# Patient Record
Sex: Female | Born: 1985 | Hispanic: No | Marital: Married | Smoking: Current every day smoker
Health system: Southern US, Community
[De-identification: ages and names within clinical notes are randomized; demographics above are authoritative.]

## PROBLEM LIST (undated history)

## (undated) DIAGNOSIS — L732 Hidradenitis suppurativa: Secondary | ICD-10-CM

## (undated) DIAGNOSIS — I10 Essential (primary) hypertension: Secondary | ICD-10-CM

## (undated) DIAGNOSIS — A630 Anogenital (venereal) warts: Secondary | ICD-10-CM

## (undated) DIAGNOSIS — D649 Anemia, unspecified: Secondary | ICD-10-CM

## (undated) DIAGNOSIS — R87629 Unspecified abnormal cytological findings in specimens from vagina: Secondary | ICD-10-CM

## (undated) HISTORY — DX: Hidradenitis suppurativa: L73.2

## (undated) HISTORY — DX: Anogenital (venereal) warts: A63.0

## (undated) HISTORY — DX: Unspecified abnormal cytological findings in specimens from vagina: R87.629

## (undated) SURGERY — EXCISION, HIDRADENITIS, AXILLA
Anesthesia: General | Laterality: Left

---
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
## 2003-05-05 ENCOUNTER — Encounter: Admission: RE | Admit: 2003-05-05 | Discharge: 2003-05-05 | Payer: Self-pay | Admitting: Cardiology

## 2003-05-05 ENCOUNTER — Encounter: Payer: Self-pay | Admitting: Cardiology

## 2003-07-16 ENCOUNTER — Other Ambulatory Visit: Admission: RE | Admit: 2003-07-16 | Discharge: 2003-07-16 | Payer: Self-pay | Admitting: Obstetrics & Gynecology

## 2005-09-13 ENCOUNTER — Other Ambulatory Visit: Admission: RE | Admit: 2005-09-13 | Discharge: 2005-09-13 | Payer: Self-pay | Admitting: Obstetrics and Gynecology

## 2006-02-04 ENCOUNTER — Emergency Department (HOSPITAL_COMMUNITY): Admission: EM | Admit: 2006-02-04 | Discharge: 2006-02-04 | Payer: Self-pay | Admitting: Emergency Medicine

## 2006-03-30 ENCOUNTER — Inpatient Hospital Stay (HOSPITAL_COMMUNITY): Admission: AD | Admit: 2006-03-30 | Discharge: 2006-04-02 | Payer: Self-pay | Admitting: Obstetrics and Gynecology

## 2006-05-08 ENCOUNTER — Other Ambulatory Visit: Admission: RE | Admit: 2006-05-08 | Discharge: 2006-05-08 | Payer: Self-pay | Admitting: Obstetrics and Gynecology

## 2007-06-20 ENCOUNTER — Other Ambulatory Visit: Admission: RE | Admit: 2007-06-20 | Discharge: 2007-06-20 | Payer: Self-pay | Admitting: Obstetrics and Gynecology

## 2008-05-15 ENCOUNTER — Ambulatory Visit (HOSPITAL_COMMUNITY): Admission: RE | Admit: 2008-05-15 | Discharge: 2008-05-15 | Payer: Self-pay | Admitting: Obstetrics

## 2008-05-15 IMAGING — US US OB DETAIL+14 WK
1 series · 14 of 28 positions shown · non-contrast
Comparison: none

OBSTETRICAL ULTRASOUND:
 This ultrasound exam was performed in the [HOSPITAL] Ultrasound Department.  The OB US report was generated in the AS system, and faxed to the ordering physician.  This report is also available in [REDACTED] PACS.

[Series 1: us ob detail+14 wk · 0.28mm/px · 14 of 86 slices shown]
[im 4/86]
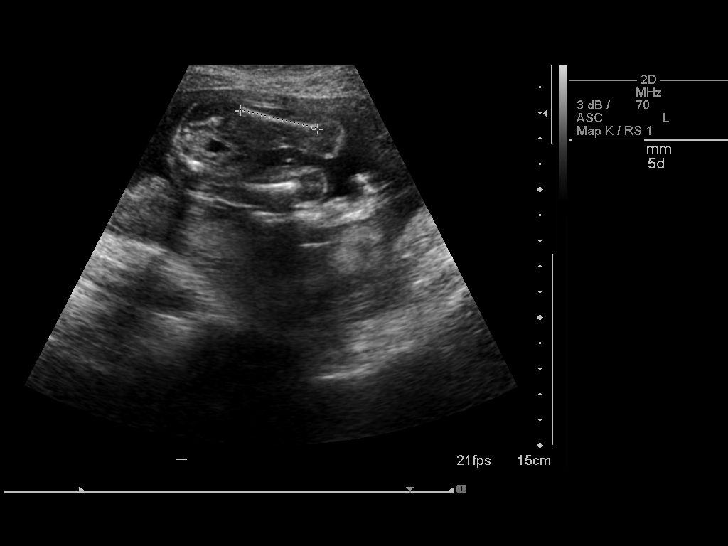
[im 10/86]
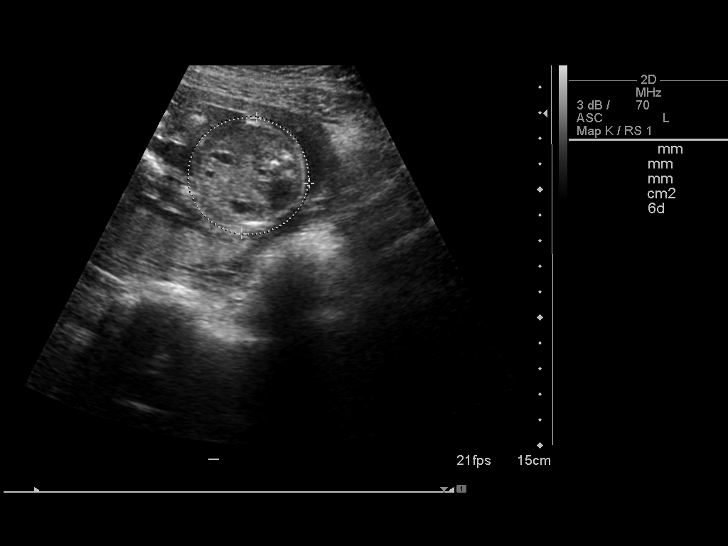
[im 16/86]
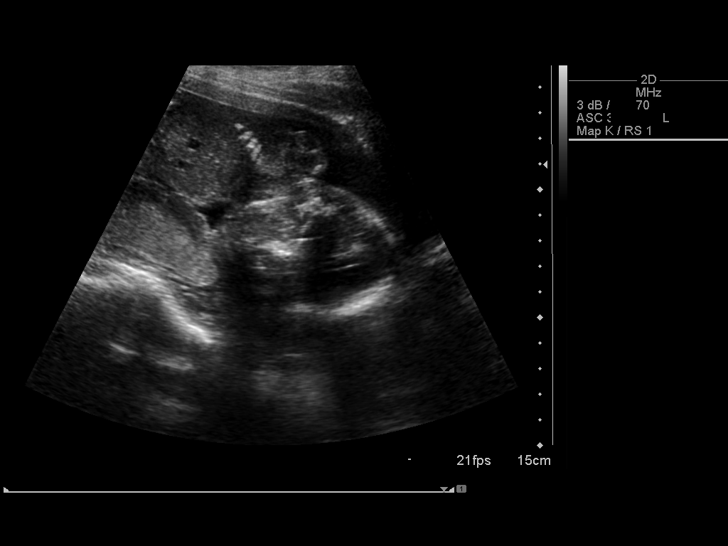
[im 23/86]
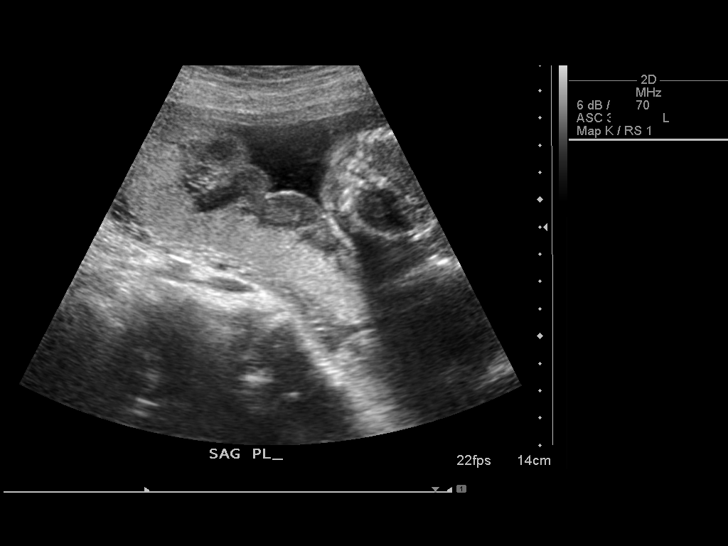
[im 29/86]
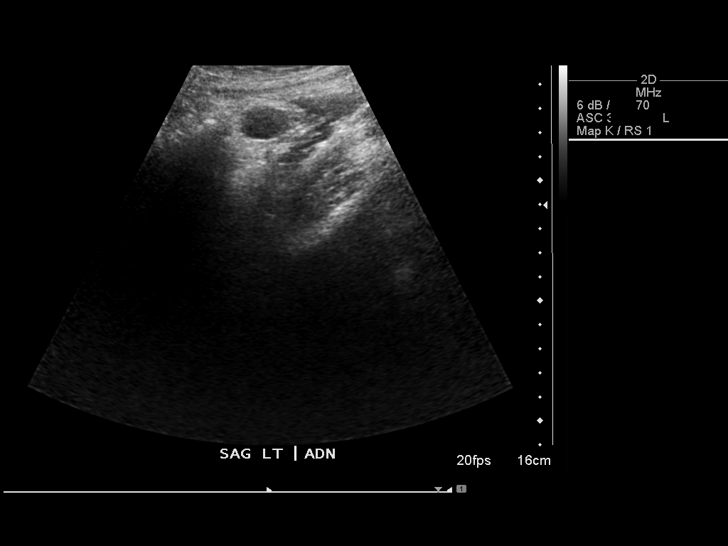
[im 35/86]
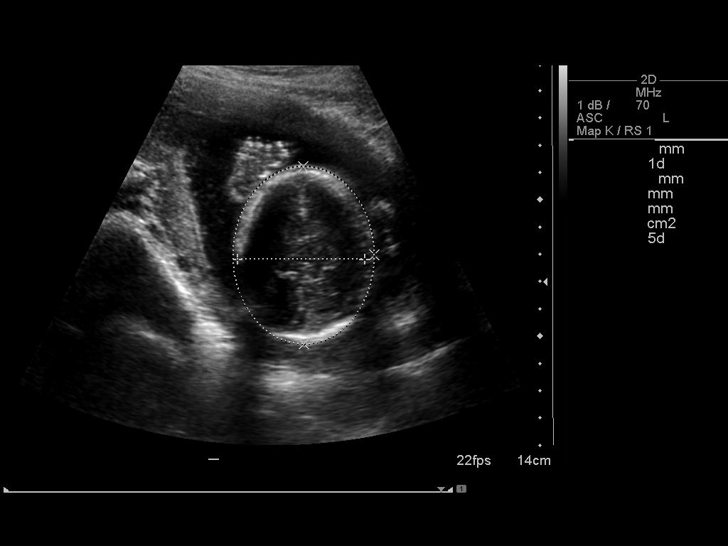
[im 41/86]
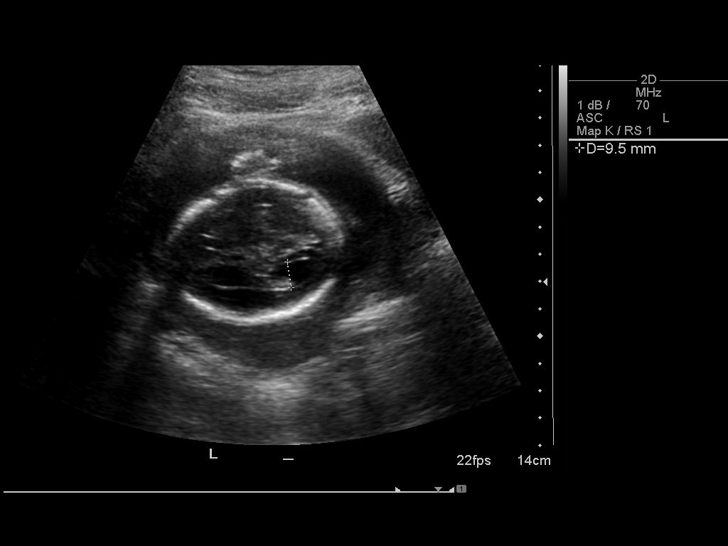
[im 48/86]
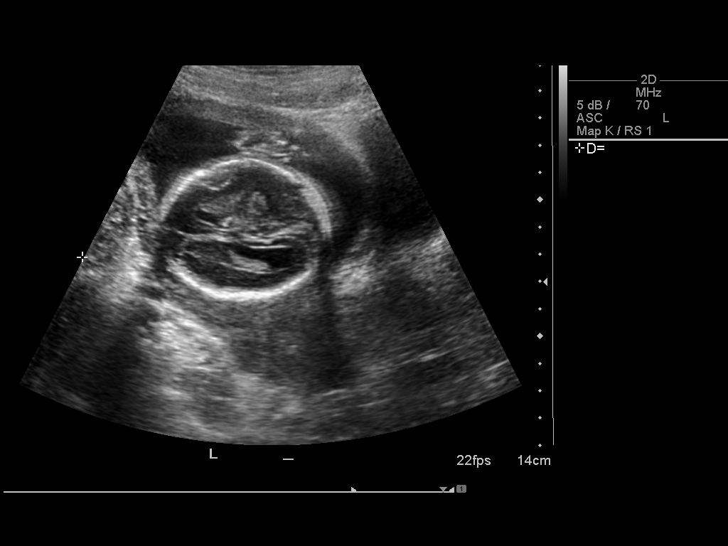
[im 54/86]
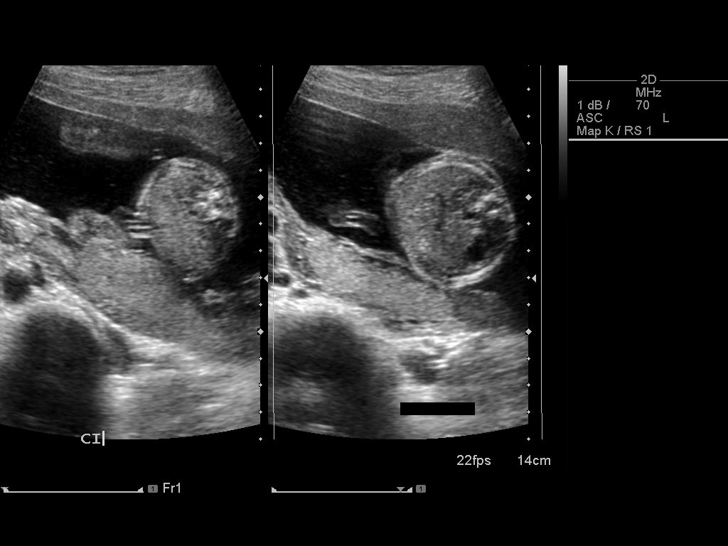
[im 60/86]
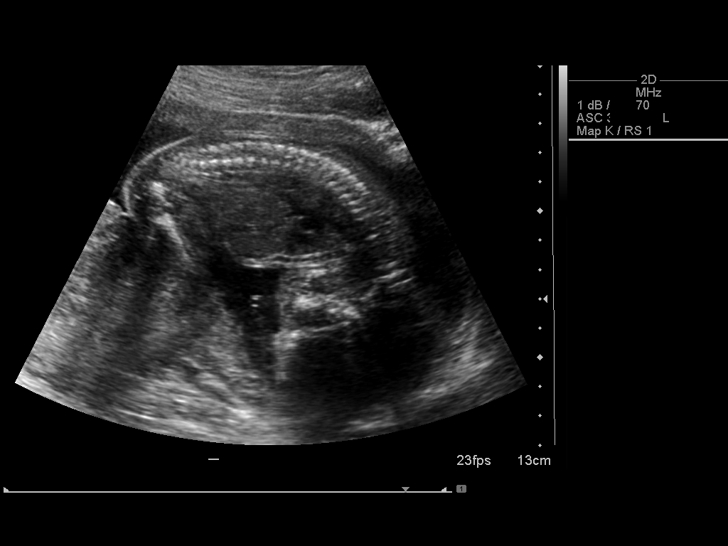
[im 67/86]
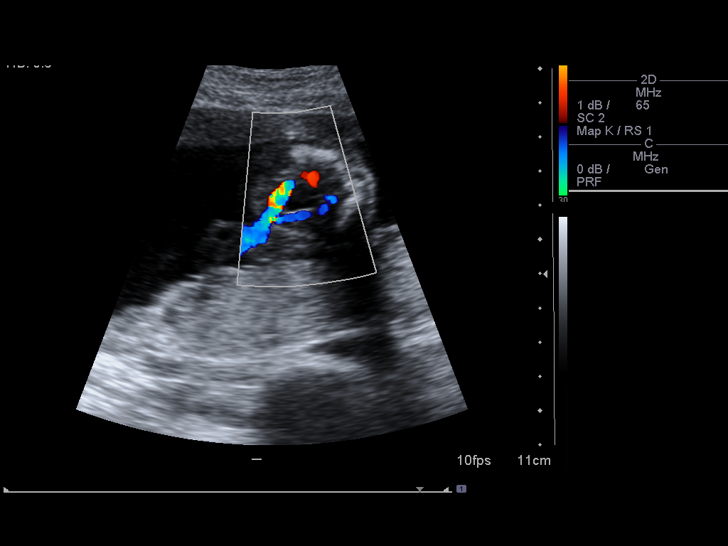
[im 73/86]
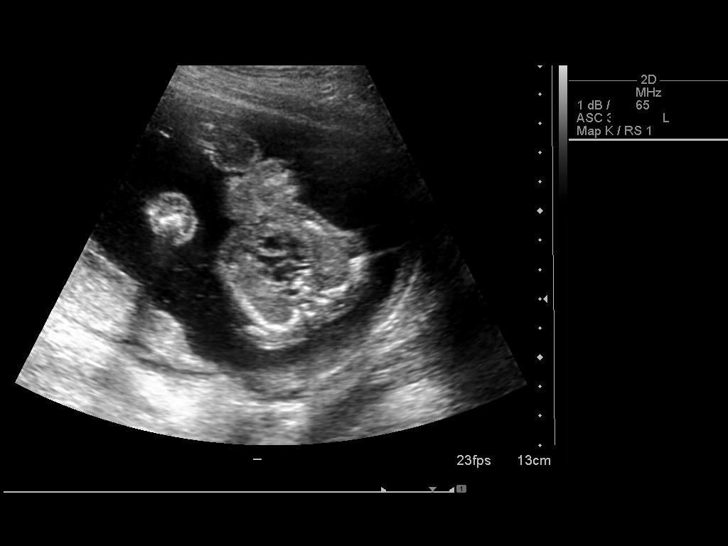
[im 79/86]
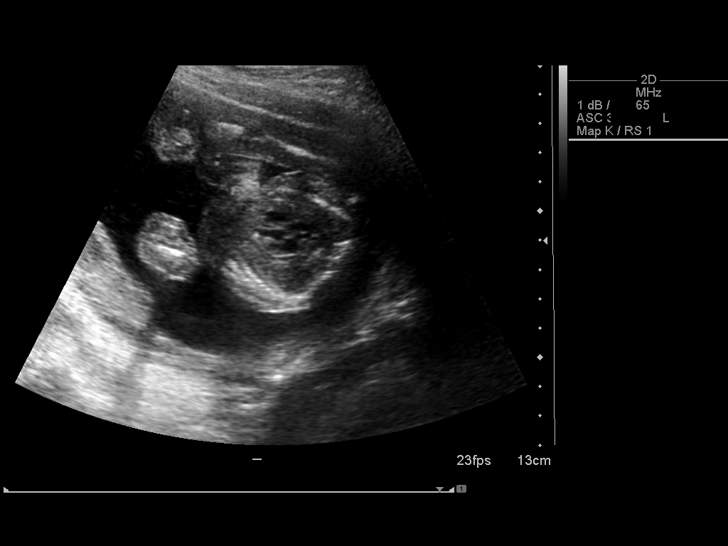
[im 86/86]
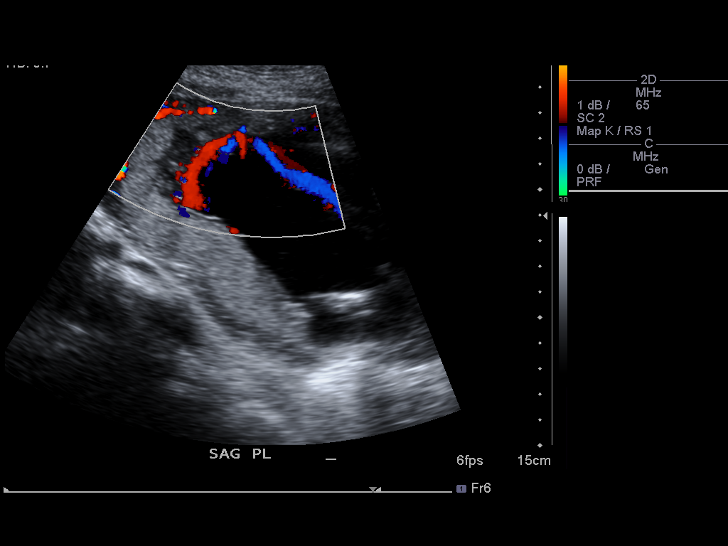

[14 of 28 positions shown; findings below may reference images not displayed]

IMPRESSION: See AS Obstetric US report.

## 2008-06-07 ENCOUNTER — Inpatient Hospital Stay (HOSPITAL_COMMUNITY): Admission: AD | Admit: 2008-06-07 | Discharge: 2008-06-07 | Payer: Self-pay | Admitting: Obstetrics

## 2008-06-12 ENCOUNTER — Ambulatory Visit (HOSPITAL_COMMUNITY): Admission: RE | Admit: 2008-06-12 | Discharge: 2008-06-12 | Payer: Self-pay | Admitting: Obstetrics

## 2008-07-03 ENCOUNTER — Ambulatory Visit (HOSPITAL_COMMUNITY): Admission: RE | Admit: 2008-07-03 | Discharge: 2008-07-03 | Payer: Self-pay | Admitting: Obstetrics

## 2008-09-10 ENCOUNTER — Inpatient Hospital Stay (HOSPITAL_COMMUNITY): Admission: AD | Admit: 2008-09-10 | Discharge: 2008-09-13 | Payer: Self-pay | Admitting: Obstetrics

## 2008-09-30 ENCOUNTER — Emergency Department (HOSPITAL_COMMUNITY): Admission: EM | Admit: 2008-09-30 | Discharge: 2008-09-30 | Payer: Self-pay | Admitting: Emergency Medicine

## 2009-08-11 ENCOUNTER — Emergency Department (HOSPITAL_COMMUNITY): Admission: EM | Admit: 2009-08-11 | Discharge: 2009-08-11 | Payer: Self-pay | Admitting: Emergency Medicine

## 2010-05-12 ENCOUNTER — Emergency Department (HOSPITAL_COMMUNITY): Admission: EM | Admit: 2010-05-12 | Discharge: 2010-05-12 | Payer: Self-pay | Admitting: Emergency Medicine

## 2010-11-17 LAB — CULTURE, ROUTINE-ABSCESS

## 2010-12-19 LAB — CBC
HCT: 25.6 % — ABNORMAL LOW (ref 36.0–46.0)
HCT: 30.4 % — ABNORMAL LOW (ref 36.0–46.0)
Hemoglobin: 10.1 g/dL — ABNORMAL LOW (ref 12.0–15.0)
Hemoglobin: 8.7 g/dL — ABNORMAL LOW (ref 12.0–15.0)
MCHC: 33.2 g/dL (ref 30.0–36.0)
MCHC: 34.1 g/dL (ref 30.0–36.0)
MCV: 92.5 fL (ref 78.0–100.0)
MCV: 92.6 fL (ref 78.0–100.0)
Platelets: 192 K/uL (ref 150–400)
Platelets: 204 K/uL (ref 150–400)
RBC: 2.77 MIL/uL — ABNORMAL LOW (ref 3.87–5.11)
RBC: 3.29 MIL/uL — ABNORMAL LOW (ref 3.87–5.11)
RDW: 13.5 % (ref 11.5–15.5)
RDW: 13.6 % (ref 11.5–15.5)
WBC: 10.1 K/uL (ref 4.0–10.5)
WBC: 13.8 K/uL — ABNORMAL HIGH (ref 4.0–10.5)

## 2010-12-19 LAB — RPR: RPR Ser Ql: NONREACTIVE

## 2011-01-20 NOTE — Consult Note (Signed)
NAMEGALE, KLAR NO.:  [id number]   MEDICAL RECORD NO.:  [id number]          PATIENT TYPE:  EMS   LOCATION:  ED                           FACILITY:  [facility]   PHYSICIAN:  Lorne Skeens. Hoxworth, M.D.DATE OF BIRTH:  [birth year]   DATE OF CONSULTATION:  02/04/2006  DATE OF DISCHARGE:                                   CONSULTATION   CHIEF COMPLAINT:  Pain, swelling, left thigh.   PRESENT ILLNESS:  Shannon Montoya is a 25 year old black female seen at the  request of Dr. Earl Lites from urgent medical.  She was seen in the [facility] Emergency Room.  She is a 19-year black female who is 62 weeks  estimated gestational age with her first pregnancy.  She presents  complaining of four to five-day history of increasing pain, swelling, and  tenderness of her proximal left thigh.  She has no history of any similar  symptoms.  No subjective fever or chills.   PAST MEDICAL HISTORY:  Very healthy.  No previous medical or surgical  illnesses.   ILLNESSES:  Medications.   ALLERGIES:  None.   SOCIAL HISTORY:  No cigarettes, alcohol.   FAMILY HISTORY:  Noncontributory.   REVIEW OF SYSTEMS:  All negative.   PHYSICAL EXAMINATION:  VITAL SIGNS:  Temperature is 99 heart rate 91,  respirations 16, blood pressure 116/71.  GENERAL:  A well-developed, alert and young black female.  SKIN:  Warm and dry.  LUNGS:  Clear without wheezing or increasd work of breathing.  ABDOMEN:  Soft, nontender.  Uterus consistent with a 29-week pregnancy.  CARDIAC:  Regular rate and rhythm without murmurs.  EXTREMITIES:  In the proximal left thigh immediately a couple of centimeters  below the groin crease is an approximately 5 x 3 cm area of fluctuance,  tenderness and about 2 cm of surrounding erythema.  No drainage.   ASSESSMENT/PLAN:  Left thigh abscess.   Under local anesthesia in the emergency room after explaining the procedure  this was incised and a large amount of purulent material  drained.  Iodoform  wick was left in the abscess cavity.  She will be started on Keflex 500 mg  t.i.d.  Cultures were obtained.  She is to call the office for any  worsening.  Wound care was explained.  I will see her back in follow-up in  three to four days.      Lorne Skeens. Hoxworth, M.D.  Electronically Signed     BTH/MEDQ  D:  02/04/2006  T:  02/05/2006  Job:  [id number]   cc:   Brett Canales A. Cleta Alberts, M.D.  Fax: [phone]   Rudy Jew. Ashley Royalty, M.D.  Fax: [phone]

## 2011-01-20 NOTE — Discharge Summary (Signed)
[name], [name]             ACCOUNT NO.:  [id number]   MEDICAL RECORD NO.:  [id number]          PATIENT TYPE:  INP   LOCATION:  9118                          FACILITY:  [facility]   PHYSICIAN:  James A. Ashley Royalty, M.D.DATE OF BIRTH:  February 22, 1986   DATE OF ADMISSION:  03/31/2006  DATE OF DISCHARGE:  04/02/2006                                 DISCHARGE SUMMARY   DISCHARGE DIAGNOSES:  1. Intrauterine pregnancy at 36 weeks 4 days gestation, labor.  2. Group B strep carrier.  3. Spontaneous rupture of membranes.  4. Pregnancy-induced hypertension.  5. Term birth living child, vertex.   CONSULTATIONS:  None.   DISCHARGE MEDICATIONS:  1. Percocet.  2. Motrin 600 mg.   HISTORY AND PHYSICAL:  This is a 25 year old, gravida 2, para 0-0-1-0, 36  weeks 4 days gestation.  The patient presented complaining of spontaneous  rupture of membranes.  Cervix was alleged to be 1 cm dilated in Maternity  Admissions Unit.  Initial blood pressures were noted to be in the 130/80  range.   HOSPITAL COURSE:  The patient was admitted to [facility]  [place].  Admission laboratory studies were drawn including a PIH panel  which was reassuring.  Pitocin was initiated as well as penicillin.  The  patient went on to deliver on 03/31/06.  The infant was noted to be a 4-  pound 3-ounce female, Apgars 9 at 1 minute and 9 at 5 minutes, sent to the  newborn nursery.  Delivery was accomplished by Dr. Sydnee Cabal over an intact  perineum.  There were no lacerations.  Magnesium sulfate therapy was  initiated according to the department's notes, but on cursory examination of  the orders I did not see an order for same.  At any rate on 04/01/06 the  patient was felt by Dr. Sydnee Cabal to have improvement of her pregnancy  associated hypertension.  On 04/02/06 she was evaluated by Dr. Ashley Royalty.  She was not on magnesium.  She was felt to be stable for discharge and was  discharged home afebrile in satisfactory  condition.      James A. Ashley Royalty, M.D.  Electronically Signed     JAM/MEDQ  D:  05/09/2006  T:  05/10/2006  Job:  [id number]

## 2013-05-07 ENCOUNTER — Encounter: Payer: Self-pay | Admitting: Obstetrics & Gynecology

## 2013-05-07 ENCOUNTER — Ambulatory Visit (INDEPENDENT_AMBULATORY_CARE_PROVIDER_SITE_OTHER): Payer: BC Managed Care – PPO | Admitting: Obstetrics & Gynecology

## 2013-05-07 VITALS — BP 126/69 | HR 102 | Temp 98.2°F | Ht 65.0 in | Wt 149.6 lb

## 2013-05-07 DIAGNOSIS — Z0000 Encounter for general adult medical examination without abnormal findings: Secondary | ICD-10-CM

## 2013-05-07 DIAGNOSIS — Z3202 Encounter for pregnancy test, result negative: Secondary | ICD-10-CM

## 2013-05-07 DIAGNOSIS — Z01419 Encounter for gynecological examination (general) (routine) without abnormal findings: Secondary | ICD-10-CM

## 2013-05-07 MED ORDER — VARENICLINE TARTRATE 0.5 MG X 11 & 1 MG X 42 PO MISC: ORAL | Status: DC

## 2013-05-07 MED ORDER — ETONOGESTREL-ETHINYL ESTRADIOL 0.12-0.015 MG/24HR VA RING: VAGINAL_RING | VAGINAL | Status: DC

## 2013-05-07 NOTE — Progress Notes (Signed)
.   Subjective:     Shannon Montoya is a 27 y.o. female here for a routine exam.  Current complaints: none.  Personal health questionnaire reviewed: no.   Gynecologic History No LMP recorded. Patient is not currently having periods (Reason: Other). Contraception: NuvaRing vaginal inserts Last Pap: 07/2011. Results were: normal Last mammogram:N/A  Obstetric History OB History  No data available     The following portions of the patient's history were reviewed and updated as appropriate: allergies, current medications, past family history, past medical history, past social history, past surgical history and problem list.  Review of Systems Pertinent items are noted in HPI.    Objective:    General appearance: alert Breasts: normal appearance, no masses or tenderness Abdomen: soft, non-tender; bowel sounds normal; no masses,  no organomegaly Pelvic: cervix normal in appearance, external genitalia normal, no adnexal masses or tenderness, uterus normal size, shape, and consistency and vagina normal without discharge    Assessment:    Healthy female exam.    Plan:    Refill Nuva Ring for contraception Counseled re: smoking cessation; Chantix prescribed Return prn

## 2013-05-08 LAB — PAP IG, CT-NG, RFX HPV ASCU
Chlamydia Probe Amp: NEGATIVE
GC Probe Amp: NEGATIVE

## 2013-05-09 ENCOUNTER — Encounter: Payer: Self-pay | Admitting: Obstetrics & Gynecology

## 2013-05-09 NOTE — Patient Instructions (Signed)
Health Maintenance, Females A healthy lifestyle and preventative care can promote health and wellness.  Maintain regular health, dental, and eye exams.  Eat a healthy diet. Foods like vegetables, fruits, whole grains, low-fat dairy products, and lean protein foods contain the nutrients you need without too many calories. Decrease your intake of foods high in solid fats, added sugars, and salt. Get information about a proper diet from your caregiver, if necessary.  Regular physical exercise is one of the most important things you can do for your health. Most adults should get at least 150 minutes of moderate-intensity exercise (any activity that increases your heart rate and causes you to sweat) each week. In addition, most adults need muscle-strengthening exercises on 2 or more days a week.   Maintain a healthy weight. The body mass index (BMI) is a screening tool to identify possible weight problems. It provides an estimate of body fat based on height and weight. Your caregiver can help determine your BMI, and can help you achieve or maintain a healthy weight. For adults 20 years and older:  A BMI below 18.5 is considered underweight.  A BMI of 18.5 to 24.9 is normal.  A BMI of 25 to 29.9 is considered overweight.  A BMI of 30 and above is considered obese.  Maintain normal blood lipids and cholesterol by exercising and minimizing your intake of saturated fat. Eat a balanced diet with plenty of fruits and vegetables. Blood tests for lipids and cholesterol should begin at age 20 and be repeated every 5 years. If your lipid or cholesterol levels are high, you are over 50, or you are a high risk for heart disease, you may need your cholesterol levels checked more frequently.Ongoing high lipid and cholesterol levels should be treated with medicines if diet and exercise are not effective.  If you smoke, find out from your caregiver how to quit. If you do not use tobacco, do not start.  If you  are pregnant, do not drink alcohol. If you are breastfeeding, be very cautious about drinking alcohol. If you are not pregnant and choose to drink alcohol, do not exceed 1 drink per day. One drink is considered to be 12 ounces (355 mL) of beer, 5 ounces (148 mL) of wine, or 1.5 ounces (44 mL) of liquor.  Avoid use of street drugs. Do not share needles with anyone. Ask for help if you need support or instructions about stopping the use of drugs.  High blood pressure causes heart disease and increases the risk of stroke. Blood pressure should be checked at least every 1 to 2 years. Ongoing high blood pressure should be treated with medicines, if weight loss and exercise are not effective.  If you are 55 to 29 years old, ask your caregiver if you should take aspirin to prevent strokes.  Diabetes screening involves taking a blood sample to check your fasting blood sugar level. This should be done once every 3 years, after age 45, if you are within normal weight and without risk factors for diabetes. Testing should be considered at a younger age or be carried out more frequently if you are overweight and have at least 1 risk factor for diabetes.  Breast cancer screening is essential preventative care for women. You should practice "breast self-awareness." This means understanding the normal appearance and feel of your breasts and may include breast self-examination. Any changes detected, no matter how small, should be reported to a caregiver. Women in their 20s and 30s should have   a clinical breast exam (CBE) by a caregiver as part of a regular health exam every 1 to 3 years. After age 40, women should have a CBE every year. Starting at age 40, women should consider having a mammogram (breast X-ray) every year. Women who have a family history of breast cancer should talk to their caregiver about genetic screening. Women at a high risk of breast cancer should talk to their caregiver about having an MRI and a  mammogram every year.  The Pap test is a screening test for cervical cancer. Women should have a Pap test starting at age 21. Between ages 21 and 29, Pap tests should be repeated every 2 years. Beginning at age 30, you should have a Pap test every 3 years as long as the past 3 Pap tests have been normal. If you had a hysterectomy for a problem that was not cancer or a condition that could lead to cancer, then you no longer need Pap tests. If you are between ages 65 and 70, and you have had normal Pap tests going back 10 years, you no longer need Pap tests. If you have had past treatment for cervical cancer or a condition that could lead to cancer, you need Pap tests and screening for cancer for at least 20 years after your treatment. If Pap tests have been discontinued, risk factors (such as a new sexual partner) need to be reassessed to determine if screening should be resumed. Some women have medical problems that increase the chance of getting cervical cancer. In these cases, your caregiver may recommend more frequent screening and Pap tests.  The human papillomavirus (HPV) test is an additional test that may be used for cervical cancer screening. The HPV test looks for the virus that can cause the cell changes on the cervix. The cells collected during the Pap test can be tested for HPV. The HPV test could be used to screen women aged 30 years and older, and should be used in women of any age who have unclear Pap test results. After the age of 30, women should have HPV testing at the same frequency as a Pap test.  Colorectal cancer can be detected and often prevented. Most routine colorectal cancer screening begins at the age of 50 and continues through age 75. However, your caregiver may recommend screening at an earlier age if you have risk factors for colon cancer. On a yearly basis, your caregiver may provide home test kits to check for hidden blood in the stool. Use of a small camera at the end of a  tube, to directly examine the colon (sigmoidoscopy or colonoscopy), can detect the earliest forms of colorectal cancer. Talk to your caregiver about this at age 50, when routine screening begins. Direct examination of the colon should be repeated every 5 to 10 years through age 75, unless early forms of pre-cancerous polyps or small growths are found.  Hepatitis C blood testing is recommended for all people born from 1945 through 1965 and any individual with known risks for hepatitis C.  Practice safe sex. Use condoms and avoid high-risk sexual practices to reduce the spread of sexually transmitted infections (STIs). Sexually active women aged 25 and younger should be checked for Chlamydia, which is a common sexually transmitted infection. Older women with new or multiple partners should also be tested for Chlamydia. Testing for other STIs is recommended if you are sexually active and at increased risk.  Osteoporosis is a disease in which the   bones lose minerals and strength with aging. This can result in serious bone fractures. The risk of osteoporosis can be identified using a bone density scan. Women ages 1 and over and women at risk for fractures or osteoporosis should discuss screening with their caregivers. Ask your caregiver whether you should be taking a calcium supplement or vitamin D to reduce the rate of osteoporosis.  Menopause can be associated with physical symptoms and risks. Hormone replacement therapy is available to decrease symptoms and risks. You should talk to your caregiver about whether hormone replacement therapy is right for you.  Use sunscreen with a sun protection factor (SPF) of 30 or greater. Apply sunscreen liberally and repeatedly throughout the day. You should seek shade when your shadow is shorter than you. Protect yourself by wearing long sleeves, pants, a wide-brimmed hat, and sunglasses year round, whenever you are outdoors.  Notify your caregiver of new moles or  changes in moles, especially if there is a change in shape or color. Also notify your caregiver if a mole is larger than the size of a pencil eraser.  Stay current with your immunizations. Document Released: 03/06/2011 Document Revised: 11/13/2011 Document Reviewed: 03/06/2011 [facility] Patient Information 2014 [place], [place]. Smoking Cessation Quitting smoking is important to your health and has many advantages. However, it is not always easy to quit since nicotine is a very addictive drug. Often times, people try 3 times or more before being able to quit. This document explains the best ways for you to prepare to quit smoking. Quitting takes hard work and a lot of effort, but you can do it. ADVANTAGES OF QUITTING SMOKING  You will live longer, feel better, and live better.  Your body will feel the impact of quitting smoking almost immediately.  Within 20 minutes, blood pressure decreases. Your pulse returns to its normal level.  After 8 hours, carbon monoxide levels in the blood return to normal. Your oxygen level increases.  After 24 hours, the chance of having a heart attack starts to decrease. Your breath, hair, and body stop smelling like smoke.  After 48 hours, damaged nerve endings begin to recover. Your sense of taste and smell improve.  After 72 hours, the body is virtually free of nicotine. Your bronchial tubes relax and breathing becomes easier.  After 2 to 12 weeks, lungs can hold more air. Exercise becomes easier and circulation improves.  The risk of having a heart attack, stroke, cancer, or lung disease is greatly reduced.  After 1 year, the risk of coronary heart disease is cut in half.  After 5 years, the risk of stroke falls to the same as a nonsmoker.  After 10 years, the risk of lung cancer is cut in half and the risk of other cancers decreases significantly.  After 15 years, the risk of coronary heart disease drops, usually to the level of a nonsmoker.  If you  are pregnant, quitting smoking will improve your chances of having a healthy baby.  The people you live with, especially any children, will be healthier.  You will have extra money to spend on things other than cigarettes. QUESTIONS TO THINK ABOUT BEFORE ATTEMPTING TO QUIT You may want to talk about your answers with your caregiver.  Why do you want to quit?  If you tried to quit in the past, what helped and what did not?  What will be the most difficult situations for you after you quit? How will you plan to handle them?  Who can  help you through the tough times? Your family? Friends? A caregiver?  What pleasures do you get from smoking? What ways can you still get pleasure if you quit? Here are some questions to ask your caregiver:  How can you help me to be successful at quitting?  What medicine do you think would be best for me and how should I take it?  What should I do if I need more help?  What is smoking withdrawal like? How can I get information on withdrawal? GET READY  Set a quit date.  Change your environment by getting rid of all cigarettes, ashtrays, matches, and lighters in your home, car, or work. Do not let people smoke in your home.  Review your past attempts to quit. Think about what worked and what did not. GET SUPPORT AND ENCOURAGEMENT You have a better chance of being successful if you have help. You can get support in many ways.  Tell your family, friends, and co-workers that you are going to quit and need their support. Ask them not to smoke around you.  Get individual, group, or telephone counseling and support. Programs are available at [employer] and health centers. Call your local health department for information about programs in your area.  Spiritual beliefs and practices may help some smokers quit.  Download a "quit meter" on your computer to keep track of quit statistics, such as how long you have gone without smoking, cigarettes not  smoked, and money saved.  Get a self-help book about quitting smoking and staying off of tobacco. LEARN NEW SKILLS AND BEHAVIORS  Distract yourself from urges to smoke. Talk to someone, go for a walk, or occupy your time with a task.  Change your normal routine. Take a different route to work. Drink tea instead of coffee. Eat breakfast in a different place.  Reduce your stress. Take a hot bath, exercise, or read a book.  Plan something enjoyable to do every day. Reward yourself for not smoking.  Explore interactive web-based programs that specialize in helping you quit. GET MEDICINE AND USE IT CORRECTLY Medicines can help you stop smoking and decrease the urge to smoke. Combining medicine with the above behavioral methods and support can greatly increase your chances of successfully quitting smoking.  Nicotine replacement therapy helps deliver nicotine to your body without the negative effects and risks of smoking. Nicotine replacement therapy includes nicotine gum, lozenges, inhalers, nasal sprays, and skin patches. Some may be available over-the-counter and others require a prescription.  Antidepressant medicine helps people abstain from smoking, but how this works is unknown. This medicine is available by prescription.  Nicotinic receptor partial agonist medicine simulates the effect of nicotine in your brain. This medicine is available by prescription. Ask your caregiver for advice about which medicines to use and how to use them based on your health history. Your caregiver will tell you what side effects to look out for if you choose to be on a medicine or therapy. Carefully read the information on the package. Do not use any other product containing nicotine while using a nicotine replacement product.  RELAPSE OR DIFFICULT SITUATIONS Most relapses occur within the first 3 months after quitting. Do not be discouraged if you start smoking again. Remember, most people try several times  before finally quitting. You may have symptoms of withdrawal because your body is used to nicotine. You may crave cigarettes, be irritable, feel very hungry, cough often, get headaches, or have difficulty concentrating. The withdrawal symptoms  are only temporary. They are strongest when you first quit, but they will go away within 10 14 days. To reduce the chances of relapse, try to:  Avoid drinking alcohol. Drinking lowers your chances of successfully quitting.  Reduce the amount of caffeine you consume. Once you quit smoking, the amount of caffeine in your body increases and can give you symptoms, such as a rapid heartbeat, sweating, and anxiety.  Avoid smokers because they can make you want to smoke.  Do not let weight gain distract you. Many smokers will gain weight when they quit, usually less than 10 pounds. Eat a healthy diet and stay active. You can always lose the weight gained after you quit.  Find ways to improve your mood other than smoking. FOR MORE INFORMATION  www.smokefree.gov  Document Released: 08/15/2001 Document Revised: 02/20/2012 Document Reviewed: 11/30/2011 [facility] Patient Information 2014 [place], [place].

## 2013-12-02 ENCOUNTER — Ambulatory Visit: Payer: BC Managed Care – PPO | Admitting: Obstetrics

## 2013-12-08 ENCOUNTER — Encounter: Payer: Self-pay | Admitting: Obstetrics & Gynecology

## 2014-05-13 ENCOUNTER — Ambulatory Visit (INDEPENDENT_AMBULATORY_CARE_PROVIDER_SITE_OTHER): Payer: BC Managed Care – PPO | Admitting: Family Medicine

## 2014-05-13 VITALS — BP 110/70 | HR 81 | Temp 99.0°F | Resp 16 | Ht 66.0 in | Wt 156.2 lb

## 2014-05-13 DIAGNOSIS — R5381 Other malaise: Secondary | ICD-10-CM

## 2014-05-13 DIAGNOSIS — J029 Acute pharyngitis, unspecified: Secondary | ICD-10-CM

## 2014-05-13 DIAGNOSIS — R5383 Other fatigue: Secondary | ICD-10-CM

## 2014-05-13 LAB — POCT RAPID STREP A (OFFICE): RAPID STREP A SCREEN: NEGATIVE

## 2014-05-13 MED ORDER — MAGIC MOUTHWASH W/LIDOCAINE: 5.0000 mL | Freq: Four times a day (QID) | ORAL | Status: DC | PRN

## 2014-05-13 MED ORDER — PENICILLIN V POTASSIUM 500 MG PO TABS: 500.0000 mg | ORAL_TABLET | Freq: Two times a day (BID) | ORAL | Status: DC

## 2014-05-13 NOTE — Progress Notes (Addendum)
[facility] [address] [place] [address] [phone]  Date:  05/13/2014   Name:  Shannon Montoya   DOB:  1986/04/16   MRN:  [id number]  PCP:  No primary provider on file.    Chief Complaint: Sore Throat   History of Present Illness:  Shannon Montoya is a 28 y.o. very pleasant female patient who presents with the following:  She is here today with a ST that started yesterday and continued into today.   Occasional cough but it hurts to cough.   It hurts to talk.   Last night she noted that the front of her head hurts. She is not sure if she had a fever at home.  She has noted aches and fatigue.  Her 15 year old son has had some cough.   She has used throat losenges but nothing else as of yet.    She is otherwise generally in good health.    There are no active problems to display for this patient.   Past Medical History  Diagnosis Date  . Arthritis     History reviewed. No pertinent past surgical history.  History  Substance Use Topics  . Smoking status: Current Every Day Smoker -- 0.30 packs/day for 10 years    Types: Cigarettes  . Smokeless tobacco: Not on file  . Alcohol Use: No    Family History  Problem Relation Age of Onset  . Heart disease Mother   . Heart disease Father     No Known Allergies  Medication list has been reviewed and updated.  Current Outpatient Prescriptions on File Prior to Visit  Medication Sig Dispense Refill  . etonogestrel-ethinyl estradiol (NUVARING) 0.12-0.015 MG/24HR vaginal ring Insert vaginally and leave in place for 3 consecutive weeks, then remove for 1 week.  3 each  4  . varenicline (CHANTIX PAK) 0.5 MG X 11 & 1 MG X 42 tablet Take one 0.5 mg tablet by mouth once daily for 3 days, then increase to one 0.5 mg tablet twice daily for 4 days, then increase to one 1 mg tablet twice daily.  53 tablet  0   No current facility-administered medications on file prior to visit.    Review of Systems:  As  per HPI- otherwise negative.   Physical Examination: Filed Vitals:   05/13/14 0905  BP: 110/70  Pulse: 81  Temp: 99 F (37.2 C)  Resp: 16   Filed Vitals:   05/13/14 0905  Height:  (1.676 m)  Weight: 156 lb 3.2 oz (70.852 kg)   Body mass index is 25.22 kg/(m^2). Ideal Body Weight: Weight in (lb) to have BMI = 25: 154.6  GEN: WDWN, NAD, Non-toxic, A & O x 3, looks well HEENT: Atraumatic, Normocephalic. Neck supple. No masses.  Bilateral TM wnl, oropharynx shows right tonsillar enlargement (no abscess) without exudate.  PEERL,EOMI.   Tender cervical LAD Ears and Nose: No external deformity. CV: RRR, No M/G/R. No JVD. No thrill. No extra heart sounds. PULM: CTA B, no wheezes, crackles, rhonchi. No retractions. No resp. distress. No accessory muscle use. ABD: S, NT, ND EXTR: No c/c/e NEURO Normal gait.  PSYCH: Normally interactive. Conversant. Not depressed or anxious appearing.  Calm demeanor.   Results for orders placed in visit on 05/13/14  POCT RAPID STREP A (OFFICE)      Result Value Ref Range   Rapid Strep A Screen Negative  Negative    Assessment and Plan: Acute  pharyngitis, unspecified pharyngitis type - Plan: POCT rapid strep A, Culture, Group A Strep, penicillin v potassium (VEETID) 500 MG tablet, Alum & Mag Hydroxide-Simeth (MAGIC MOUTHWASH W/LIDOCAINE) SOLN  Likely viral pharyngitis.  She will use DMM while we await her culture. Start penicillin if positive for strep  Signed Abbe Amsterdam, MD  Called 9/12 with throat culture.  Her ST is better. She will let me know if any other concerns   Results for orders placed in visit on 05/13/14  CULTURE, GROUP A STREP      Result Value Ref Range   Organism ID, Bacteria Normal Upper Respiratory Flora     Organism ID, Bacteria No Beta Hemolytic Streptococci Isolated    POCT RAPID STREP A (OFFICE)      Result Value Ref Range   Rapid Strep A Screen Negative  Negative

## 2014-05-13 NOTE — Patient Instructions (Signed)
I think you likely have a virus and will get better without antibiotics. However I am also going to do a throat culture to make sure you do not have strep. Hold onto the penicillin rx for now.  If you continue to feel very bad tomorrow start the rx.  I will be in touch with your culture asap.  Use the "magic mouthwash" as needed also

## 2014-05-15 LAB — CULTURE, GROUP A STREP: ORGANISM ID, BACTERIA: NORMAL

## 2014-08-08 ENCOUNTER — Emergency Department (HOSPITAL_COMMUNITY)
Admission: EM | Admit: 2014-08-08 | Discharge: 2014-08-08 | Disposition: A | Discharge status: Home / Self Care | Payer: BC Managed Care – PPO | Source: Home / Self Care | Attending: Emergency Medicine | Admitting: Emergency Medicine

## 2014-08-08 ENCOUNTER — Encounter (HOSPITAL_COMMUNITY): Payer: Self-pay | Admitting: *Deleted

## 2014-08-08 DIAGNOSIS — J209 Acute bronchitis, unspecified: Secondary | ICD-10-CM

## 2014-08-08 DIAGNOSIS — M940 Chondrocostal junction syndrome [Tietze]: Secondary | ICD-10-CM

## 2014-08-08 MED ORDER — DOXYCYCLINE HYCLATE 100 MG PO CAPS: 100.0000 mg | ORAL_CAPSULE | Freq: Two times a day (BID) | ORAL | Status: DC

## 2014-08-08 MED ORDER — HYDROCODONE-HOMATROPINE 5-1.5 MG/5ML PO SYRP: 5.0000 mL | ORAL_SOLUTION | Freq: Four times a day (QID) | ORAL | Status: DC | PRN

## 2014-08-08 MED ORDER — PREDNISONE 50 MG PO TABS: ORAL_TABLET | ORAL | Status: DC

## 2014-08-08 MED ORDER — IBUPROFEN 600 MG PO TABS: 600.0000 mg | ORAL_TABLET | Freq: Four times a day (QID) | ORAL | Status: DC | PRN

## 2014-08-08 NOTE — ED Provider Notes (Signed)
CSN: [id number]     Arrival date & time 08/08/14  [id number] History   First MD Initiated Contact with Patient 08/08/14 1035     Chief Complaint  Patient presents with  . Chest Pain   (Consider location/radiation/quality/duration/timing/severity/associated sxs/prior Treatment) HPI  She is a 28 year old woman here for evaluation of cough and chest pain. She states she has been sick with cold symptoms for the last 3 weeks. Then on Thursday, she developed a burning pain to the left of her sternum after coughing. The cough is productive. The pain never fully resolves. She also reports nasal congestion and sore throat irritation. She's also had a mild headache intermittently. No nausea, vomiting. No fevers or chills.  Past Medical History  Diagnosis Date  . Carpal tunnel syndrome     bilateral   History reviewed. No pertinent past surgical history. Family History  Problem Relation Age of Onset  . Heart disease Mother   . Heart disease Father    History  Substance Use Topics  . Smoking status: Current Every Day Smoker -- 0.30 packs/day for 10 years    Types: Cigarettes  . Smokeless tobacco: Not on file  . Alcohol Use: No   OB History    No data available     Review of Systems See history of present illness Allergies  Review of patient's allergies indicates no known allergies.  Home Medications   Prior to Admission medications   Medication Sig Start Date End Date Taking? Authorizing Provider  Cholecalciferol (VITAMIN D3) 50000 UNITS CAPS Take 1 capsule by mouth once a week.   Yes Historical Provider, MD  etonogestrel-ethinyl estradiol (NUVARING) 0.12-0.015 MG/24HR vaginal ring Insert vaginally and leave in place for 3 consecutive weeks, then remove for 1 week. 05/07/13  Yes Antionette CharLisa Jackson-Moore, MD  doxycycline (VIBRAMYCIN) 100 MG capsule Take 1 capsule (100 mg total) by mouth 2 (two) times daily. 08/08/14   Charm RingsErin J Jimma Ortman, MD  HYDROcodone-homatropine (HYCODAN) 5-1.5 MG/5ML syrup Take 5 mLs  by mouth every 6 (six) hours as needed for cough. 08/08/14   Charm RingsErin J Kahlen Boyde, MD  ibuprofen (ADVIL,MOTRIN) 600 MG tablet Take 1 tablet (600 mg total) by mouth every 6 (six) hours as needed. 08/08/14   Charm RingsErin J Shanti Eichel, MD  predniSONE (DELTASONE) 50 MG tablet Take 1 pill daily for 5 days. 08/08/14   Charm RingsErin J Astaria Nanez, MD   BP 123/86 mmHg  Pulse 90  Temp(Src) 98.5 F (36.9 C) (Oral)  Resp 12  SpO2 99%  LMP 06/09/2014 Physical Exam  Constitutional: She is oriented to person, place, and time. She appears well-developed and well-nourished. No distress.  HENT:  Head: Normocephalic and atraumatic.  Right Ear: Tympanic membrane and external ear normal.  Left Ear: Tympanic membrane and external ear normal.  Nose: Mucosal edema present.  Mouth/Throat: Oropharynx is clear and moist. Mucous membranes are not dry. No oropharyngeal exudate.  Neck: Neck supple.  Cardiovascular: Normal rate, regular rhythm and normal heart sounds.   No murmur heard. Pulmonary/Chest: Effort normal and breath sounds normal. No respiratory distress. She has no wheezes. She has no rales. She exhibits tenderness.  Lymphadenopathy:    She has no cervical adenopathy.  Neurological: She is alert and oriented to person, place, and time.    ED Course  Procedures (including critical care time) Labs Review Labs Reviewed - No data to display  Imaging Review No results found.   MDM   1. Acute bronchitis, unspecified organism   2. Costochondritis, acute  We'll treat with doxycycline and prednisone. Hycodan provided for cough. Recommended honey for cough during the day. Ibuprofen for costochondritis. Follow-up as needed.    Charm RingsErin J Valery Chance, MD 08/08/14 1120

## 2014-08-08 NOTE — Discharge Instructions (Signed)
You have bronchitis. Take doxycyline twice a day for 10 days. Take prednisone 1 pill daily for 5 days. Use the Hycodan cough syrup at bedtime as needed. This medicine has a narcotic in it so do not take it while driving. You can take a teaspoon of honey during the day for cough. Take ibuprofen 600 mg every 6 hours as needed for the chest pain.  Follow-up as needed.

## 2014-08-08 NOTE — ED Notes (Signed)
C/o cold for 3 weeks and chest pain onset Thur.  Pain is just L of sternum.  Burning pain when she coughs, but pain does not go away.  No fever.  Cough prod. of light yellow sputum.  C/o headache and runny nose.

## 2014-08-31 ENCOUNTER — Encounter: Payer: Self-pay | Admitting: *Deleted

## 2014-09-01 ENCOUNTER — Encounter: Payer: Self-pay | Admitting: Obstetrics & Gynecology

## 2015-06-30 ENCOUNTER — Emergency Department (HOSPITAL_COMMUNITY)
Admission: EM | Admit: 2015-06-30 | Discharge: 2015-06-30 | Disposition: A | Discharge status: Home / Self Care | Payer: BLUE CROSS/BLUE SHIELD | Attending: Emergency Medicine | Admitting: Emergency Medicine

## 2015-06-30 ENCOUNTER — Encounter (HOSPITAL_COMMUNITY): Payer: Self-pay

## 2015-06-30 DIAGNOSIS — Z792 Long term (current) use of antibiotics: Secondary | ICD-10-CM | POA: Insufficient documentation

## 2015-06-30 DIAGNOSIS — L732 Hidradenitis suppurativa: Secondary | ICD-10-CM

## 2015-06-30 DIAGNOSIS — Z720 Tobacco use: Secondary | ICD-10-CM | POA: Insufficient documentation

## 2015-06-30 DIAGNOSIS — L02411 Cutaneous abscess of right axilla: Secondary | ICD-10-CM | POA: Diagnosis present

## 2015-06-30 DIAGNOSIS — Z8669 Personal history of other diseases of the nervous system and sense organs: Secondary | ICD-10-CM | POA: Insufficient documentation

## 2015-06-30 MED ORDER — SULFAMETHOXAZOLE-TRIMETHOPRIM 800-160 MG PO TABS: 1.0000 | ORAL_TABLET | Freq: Once | ORAL | Status: DC

## 2015-06-30 MED ORDER — HYDROCODONE-ACETAMINOPHEN 5-325 MG PO TABS
ORAL_TABLET | ORAL | Status: DC
Start: 2015-06-30 — End: 2016-03-13

## 2015-06-30 MED ORDER — LIDOCAINE-EPINEPHRINE (PF) 2 %-1:200000 IJ SOLN
20.0000 mL | Freq: Once | INTRAMUSCULAR | Status: DC
  Filled 2015-06-30: qty 20

## 2015-06-30 MED ORDER — SULFAMETHOXAZOLE-TRIMETHOPRIM 800-160 MG PO TABS: 1.0000 | ORAL_TABLET | Freq: Two times a day (BID) | ORAL | Status: DC

## 2015-06-30 MED ORDER — MORPHINE SULFATE (PF) 4 MG/ML IV SOLN
4.0000 mg | Freq: Once | INTRAVENOUS | Status: AC
  Administered 2015-06-30: 4 mg via INTRAMUSCULAR
  Filled 2015-06-30: qty 1

## 2015-06-30 NOTE — ED Provider Notes (Signed)
CSN: [id number]     Arrival date & time 06/30/15  1337 History  By signing my name below, I, Shannon Montoya, attest that this documentation has been prepared under the direction and in the presence of [employer], PA-C. Electronically Signed: Placido Montoya, ED Scribe. 06/30/2015. 2:16 PM.   Chief Complaint  Patient presents with  . Abscess   The history is provided by the patient. No language interpreter was used.    HPI Comments: Shannon Montoya is a 29 y.o. female, with a hx of hydradenitis, who presents to the Emergency Department complaining of multiple points of moderate pain and swelling to her right axilla region with onset 1 week ago. She notes associated, intermittent, mild, subjective fever and worsening pain with movement or palpation of the affected region which radiates to her central chest. Pt notes taking ibuprofen and extra strength tylenol for pain management with her last dosage at 6:00 am today. She notes a hx of hydradenitis and having seen a surgeon years ago for similar symptoms. She denies any other associated symptoms.   Past Medical History  Diagnosis Date  . Carpal tunnel syndrome     bilateral   No past surgical history on file. Family History  Problem Relation Age of Onset  . Heart disease Mother   . Heart disease Father    Social History  Substance Use Topics  . Smoking status: Current Every Day Smoker -- 0.30 packs/day for 10 years    Types: Cigarettes  . Smokeless tobacco: Not on file  . Alcohol Use: No   OB History    No data available     Review of Systems A complete 10 system review of systems was obtained and all systems are negative except as noted in the HPI and PMH.   Allergies  Review of patient's allergies indicates no known allergies.  Home Medications   Prior to Admission medications   Medication Sig Start Date End Date Taking? Authorizing Provider  Cholecalciferol (VITAMIN D3) 50000 UNITS CAPS Take 1 capsule by mouth once  a week.    Historical Provider, MD  doxycycline (VIBRAMYCIN) 100 MG capsule Take 1 capsule (100 mg total) by mouth 2 (two) times daily. 08/08/14   Charm Rings, MD  etonogestrel-ethinyl estradiol (NUVARING) 0.12-0.015 MG/24HR vaginal ring Insert vaginally and leave in place for 3 consecutive weeks, then remove for 1 week. 05/07/13   Antionette Char, MD  HYDROcodone-homatropine [facility]) 5-1.5 MG/5ML syrup Take 5 mLs by mouth every 6 (six) hours as needed for cough. 08/08/14   Charm Rings, MD  ibuprofen (ADVIL,MOTRIN) 600 MG tablet Take 1 tablet (600 mg total) by mouth every 6 (six) hours as needed. 08/08/14   Charm Rings, MD  predniSONE (DELTASONE) 50 MG tablet Take 1 pill daily for 5 days. 08/08/14   Charm Rings, MD   There were no vitals taken for this visit. Physical Exam  Constitutional: She is oriented to person, place, and time. She appears well-developed and well-nourished.  HENT:  Head: Normocephalic and atraumatic.  Mouth/Throat: No oropharyngeal exudate.  Neck: Normal range of motion. No tracheal deviation present.  Cardiovascular: Normal rate.   Pulmonary/Chest: Effort normal. No respiratory distress.  Abdominal: Soft. There is no tenderness.  Musculoskeletal: Normal range of motion.  Neurological: She is alert and oriented to person, place, and time.  Skin: Skin is warm and dry. She is not diaphoretic.  Patient has 4-5 confluent abscesses in right axilla, one is actively draining purulent material.  No significant cellulitis.  Psychiatric: She has a normal mood and affect. Her behavior is normal.  Nursing note and vitals reviewed.  ED Course  Procedures  DIAGNOSTIC STUDIES: Oxygen Saturation is 100% on RA, normal by my interpretation.    COORDINATION OF CARE: 2:13 PM Pt presents today due to multiple abscesses to her right axilla region. Discussed treatment plan with pt at bedside including medication for pain management, cleaning of the affected region and an I&D. Pt agreed  to plan.  Labs Review Labs Reviewed - No data to display  Imaging Review No results found.   EKG Interpretation None      MDM   Final diagnoses:  Hydradenitis    Filed Vitals:   06/30/15 1402  BP: 120/69  Pulse: 92  Temp: 99.1 F (37.3 C)  Resp: 16  Height: 5\' 5"  (1.651 m)  Weight: 158 lb (71.668 kg)  SpO2: 100%    Medications  lidocaine-EPINEPHrine (XYLOCAINE W/EPI) 2 %-1:200000 (PF) injection 20 mL (not administered)  sulfamethoxazole-trimethoprim (BACTRIM DS,SEPTRA DS) 800-160 MG per tablet 1 tablet (not administered)  morphine 4 MG/ML injection 4 mg (4 mg Intramuscular Given 06/30/15 1428)    Shannon Montoya is 29 y.o. female presenting with multiple abscess to right axilla. I have attempted to IND them but patient couldn't tolerate the procedure. I got as far as single lidocaine with epinephrine injection. Patient will be started on Vicodin and Bactrim, encourage warm compresses. Surgical referral given.  Evaluation does not show pathology that would require ongoing emergent intervention or inpatient treatment. Pt is hemodynamically stable and mentating appropriately. Discussed findings and plan with patient/guardian, who agrees with care plan. All questions answered. Return precautions discussed and outpatient follow up given.   Discharge Medication List as of 06/30/2015  3:15 PM    START taking these medications   Details  HYDROcodone-acetaminophen (NORCO/VICODIN) 5-325 MG tablet Take 1-2 tablets by mouth every 6 hours as needed for pain and/or cough., Print    sulfamethoxazole-trimethoprim (BACTRIM DS) 800-160 MG tablet Take 1 tablet by mouth 2 (two) times daily., Starting 06/30/2015, Until Discontinued, Print         I personally performed the services described in this documentation, which was scribed in my presence. The recorded information has been reviewed and is accurate.   Shannon Emeryicole Aamilah Augenstein, PA-C 06/30/15 1731  Lavera Guiseana Shannon Liu, MD 07/01/15 [phone]

## 2015-06-30 NOTE — ED Notes (Signed)
Patient left prior to receiving discharge instructions and prescriptions.

## 2015-06-30 NOTE — ED Notes (Signed)
Patient c/o right axilla abscess that began a week ago and has gotten progressively worse. Patient is unable to raise the right arm due to pain.

## 2015-06-30 NOTE — Discharge Instructions (Signed)
Apply warm compresses to the area at least 4 times a day. If you change your mind and would like to have the boils lanced please return to the emergency room.  Take your antibiotics as directed and to completion. You should never have any leftover antibiotics! Push fluids and stay well hydrated.   Any antibiotic use can reduce the efficacy of hormonal birth control. Please use back up method of contraception.   Take vicodin for breakthrough pain, do not drink alcohol, drive, care for children or do other critical tasks while taking vicodin.  Do not hesitate to return to the emergency room for any new, worsening or concerning symptoms.  Please obtain primary care using resource guide below. Let them know that you were seen in the emergency room and that they will need to obtain records for further outpatient management.    Hidradenitis Suppurativa Hidradenitis suppurativa is a long-term (chronic) skin disease that starts with blocked sweat glands or hair follicles. Bacteria may grow in these blocked openings of your skin. Hidradenitis suppurativa is like a severe form of acne that develops in areas of your body where acne would be unusual. It is most likely to affect the areas of your body where skin rubs against skin and becomes moist. This includes your:  Underarms.  Groin.  Genital areas.  Buttocks.  Upper thighs.  Breasts. Hidradenitis suppurativa may start out with small pimples. The pimples can develop into deep sores that break open (rupture) and drain pus. Over time your skin may thicken and become scarred. Hidradenitis suppurativa cannot be passed from person to person.  CAUSES  The exact cause of hidradenitis suppurativa is not known. This condition may be due to:  Female and female hormones. The condition is rare before and after puberty.  An overactive body defense system (immune system). Your immune system may overreact to the blocked hair follicles or sweat glands and  cause swelling and pus-filled sores. RISK FACTORS You may have a higher risk of hidradenitis suppurativa if you:  Are a woman.  Are between ages 71 and 78.  Have a family history of hidradenitis suppurativa.  Have a personal history of acne.  Are overweight.  Smoke.  Take the drug lithium. SIGNS AND SYMPTOMS  The first signs of an outbreak are usually painful skin bumps that look like pimples. As the condition progresses:  Skin bumps may get bigger and grow deeper into the skin.  Bumps under the skin may rupture and drain smelly pus.  Skin may become itchy and infected.  Skin may thicken and scar.  Drainage may continue through tunnels under the skin (fistulas).  Walking and moving your arms can become painful. DIAGNOSIS  Your health care provider may diagnose hidradenitis suppurativa based on your medical history and your signs and symptoms. A physical exam will also be done. You may need to see a health care provider who specializes in skin diseases (dermatologist). You may also have tests done to confirm the diagnosis. These can include:  Swabbing a sample of pus or drainage from your skin so it can be sent to the lab and tested for infection.  Blood tests to check for infection. TREATMENT  The same treatment will not work for everybody with hidradenitis suppurativa. Your treatment will depend on how severe your symptoms are. You may need to try several treatments to find what works best for you. Part of your treatment may include cleaning and bandaging (dressing) your wounds. You may also have to take  medicines, such as the following:  Antibiotics.  Acne medicines.  Medicines to block or suppress the immune system.  A diabetes medicine (metformin) is sometimes used to treat this condition.  For women, birth control pills can sometimes help relieve symptoms. You may need surgery if you have a severe case of hidradenitis suppurativa that does not respond to  medicine. Surgery may involve:   Using a laser to clear the skin and remove hair follicles.  Opening and draining deep sores.  Removing the areas of skin that are diseased and scarred. HOME CARE INSTRUCTIONS  Learn as much as you can about your disease, and work closely with your health care providers.  Take medicines only as directed by your health care provider.  If you were prescribed an antibiotic medicine, finish it all even if you start to feel better.  If you are overweight, losing weight may be very helpful. Try to reach and maintain a healthy weight.  Do not use any tobacco products, including cigarettes, chewing tobacco, or electronic cigarettes. If you need help quitting, ask your health care provider.  Do not shave the areas where you get hidradenitis suppurativa.  Do not wear deodorant.  Wear loose-fitting clothes.  Try not to overheat and get sweaty.  Take a daily bleach bath as directed by your health care provider.  Fill your bathtub halfway with water.  Pour in  cup of unscented household bleach.  Soak for 5-10 minutes.  Cover sore areas with a warm, clean washcloth (compress) for 5-10 minutes. SEEK MEDICAL CARE IF:   You have a flare-up of hidradenitis suppurativa.  You have chills or a fever.  You are having trouble controlling your symptoms at home.   This information is not intended to replace advice given to you by your health care provider. Make sure you discuss any questions you have with your health care provider.   Document Released: 04/04/2004 Document Revised: 09/11/2014 Document Reviewed: 11/21/2013 Elsevier Interactive Patient Education 2016 [employer].   Emergency Department Resource Guide 1) Find a Doctor and Pay Out of Pocket Although you won't have to find out who is covered by your insurance plan, it is a good idea to ask around and get recommendations. You will then need to call the office and see if the doctor you have  chosen will accept you as a new patient and what types of options they offer for patients who are self-pay. Some doctors offer discounts or will set up payment plans for their patients who do not have insurance, but you will need to ask so you aren't surprised when you get to your appointment.  2) Contact Your Local Health Department Not all health departments have doctors that can see patients for sick visits, but many do, so it is worth a call to see if yours does. If you don't know where your local health department is, you can check in your phone book. The CDC also has a tool to help you locate your state's health department, and many state websites also have listings of all of their local health departments.  3) Find a Walk-in Clinic If your illness is not likely to be very severe or complicated, you may want to try a walk in clinic. These are popping up all over the country in pharmacies, drugstores, and shopping centers. They're usually staffed by nurse practitioners or physician assistants that have been trained to treat common illnesses and complaints. They're usually fairly quick and inexpensive. However, if you  have serious medical issues or chronic medical problems, these are probably not your best option.  No Primary Care Doctor: - Call Health Connect at  [phone] - they can help you locate a primary care doctor that  accepts your insurance, provides certain services, etc. - Physician Referral Service- [phone]  Chronic Pain Problems: Organization         Address  Phone   Notes  [facility]  [phone] Patients need to be referred by their primary care doctor.   Medication Assistance: Organization         Address  Phone   Notes  [facility] [address] [place], [place] [address] [phone] --Must be a resident of [facility] -- Must have NO insurance coverage whatsoever (no Medicaid/ Medicare,  etc.) -- The pt. MUST have a primary care doctor that directs their care regularly and follows them in the community   [facility]  [phone]   [employer]  [phone]    Agencies that provide inexpensive medical care: Organization         Address  Phone   Notes  [facility]  [phone]   [facility]    [phone]   [facility] [address] [place], [place] [address] [phone]   [facility] [address] [place] [place] [phone]   [facility]anned Parenthood    [phone]   [facility]    [phone]   [facility]  [address], [place] Phone:  [phone], Fax:  [phone] Hours of Operation:  9 am - 6 pm, M-F.  Also accepts Medicaid/Medicare and self-pay.  [facility]  [address], [place] Phone: [phone], Fax: [phone]. Hours of Operation:  8:30 am - 5:30 pm, M-F.  Also accepts Medicaid and self-pay.  [facility] [place] [address] [place] Phone: [phone]   [facility] [address] Natasha Bence [place], [place] [phone], Ext. [phone] Mondays & Thursdays: 7-9 AM.  First 15 patients are seen on a first come, first serve basis.    Medicaid-accepting [facility] Providers:  Organization         Address  Phone   Notes  [facility] [place] [address], [place] [phone] Also accepts self-pay patients.  [facility] [address] Laurell Josephs [place], [place]  [phone]   [facility] [address], [place] [phone]   [facility] [address], [place] [phone]   Renaye Rakers [address], [place]   [phone] Only accepts [place] Access [place] patients after they have their name applied to their card.   Self-Pay (no  insurance) in [facility]:  Organization         Address  Phone   Notes  Sickle Cell Patients, [facility] [address] [place], [place] [phone]   [facility] [address] [place], [place] [phone]   [facility] [place]  [address], [place] [phone]   [facility]/Dr. Osei-Bonsu  [address] [place] or [address], [place] [phone] Phone number for both [employer] and [place]  locations is the same.  [facility] [address] [place] [phone]   [facility] [address], [place] or [address] [phone]   [facility] [address] [place] [phone], phone; [phone], fax Sees patients 1st and 3rd Saturday of every month.  Must not qualify for public or private insurance (i.e. Medicaid, Medicare, [place] Health Choice, Veterans' Benefits)  Household income should be no more than 200% of the poverty level The clinic cannot treat you if you are pregnant or think you are pregnant  Sexually transmitted diseases are not treated at the clinic.    Dental Care: Organization         Address  Phone  Notes  [facility] Department of [facility] [address] [place], [place] [phone] Accepts children up to age 50 who are enrolled in [place] or [place] Health Choice; pregnant women with a Medicaid card; and children who have applied for Medicaid or [place] Health Choice, but were declined, whose parents can pay a reduced fee at time of service.  [facility] Department of [facility]  [address], [place] [phone] Accepts children up to age 37 who are enrolled in [place] or [place] Health Choice; pregnant women with a Medicaid card; and children who have applied for Medicaid or [place] Health Choice, but were  declined, whose parents can pay a reduced fee at time of service.  [facility]  [address] [place], [place] [phone] Patients are seen by appointment only. Walk-ins are not accepted. [facility] will see patients 78 years of age and older. Monday - Tuesday (8am-5pm) Most Wednesdays (8:30-5pm) $30 per visit, cash only  [facility] Adult Dental Access [facility]  [address], [facility] [phone] Patients are seen by appointment only. Walk-ins are not accepted. [facility] will see patients 51 years of age and older. One Wednesday Evening (Monthly: Volunteer Based).  $30 per visit, cash only  [employer]  [phone] for adults; Children under age 26, call Graduate Pediatric Dentistry at [phone]. Children aged 33-14, please call [phone] to request a pediatric application.  Dental services are provided in all areas of dental care including fillings, crowns and bridges, complete and partial dentures, implants, gum treatment, root canals, and extractions. Preventive care is also provided. Treatment is provided to both adults and children. Patients are selected via a lottery and there is often a waiting list.   [facility] [address] [place]  [phone] [email or web address]   [facility] [address] [place], [place] [phone], Ext. [phone] Second and Fourth Thursday of each month, opens at 6:30 AM; Clinic ends at 9 AM.  Patients are seen on a first-come first-served basis, and a limited number are seen during each clinic.   [facility]  [address] Ether Griffins [place], [place] [phone]   Eligibility Requirements You must have lived in [place], [place], or [place] counties for at least the last three months.   You cannot be eligible for state or federal sponsored [place], including [employer], [place], or [employer].   You generally cannot be  eligible for healthcare insurance through your employer.    How to apply: Eligibility screenings are held every Tuesday and Wednesday afternoon from 1:00 pm until 4:00 pm. You do not need  an appointment for the interview!  [facility] [address], [place], [place] [phone]   [facility] Health Department  [phone]   [facility] Health Department  [phone]   [facility]  [phone]    Behavioral Health Resources in the Community: Intensive Outpatient Programs Organization         Address  Phone  Notes  [facility] Services [address] [place], [place] [phone]   [facility] Outpatient [address] [place], [place] [phone]   [facility] [address], [place], [place]  [phone]   [facility] Mental Health [address]  [place], [place] [phone] or [phone]   Substance Abuse Resources Organization         Address  Phone  Notes  [facility]  [phone]   [facility]  [phone]   [facility] [place]  [phone]   Floydene Flock  [phone]   Residential & Outpatient Substance Abuse Program  [phone]   Psychological Services Organization         Address  Phone  Notes  [facility]  [phone]   [facility] Services  [phone]   [facility] Mental Health [address], [place] [phone] or [phone]    Mobile Crisis Teams Organization         Address  Phone  Notes  [facility], Mobile Crisis Care Unit  [phone]   [facility]  [address] [place], [place] [phone]   Doristine Locks [address] [place] [phone]    Self-Help/Support Groups Organization         Address  Phone             Notes  [facility] [place] - variety of support groups  [phone]- I7437963 Call for more information    Narcotics Anonymous (NA), [facility] [address], [employer] [place]  2 meetings at this location   Statistician         Address  Phone  Notes  [facility] [address],    [place]  [phone]   [facility]  [address] [place] [address], [place], [place] [phone]   [facility] Treatment Facility [address] [place], [place] [phone] Admissions: 8am-3pm M-F  [facility] [address]    [place], [place] [phone]   [facility] [address] [place], [place], [place] [phone]   [facility] [address],  [place], [place] [phone]   [facility] - Intensive Outpatient [address] Laurell Josephs [address], [place], [place] [phone]   [facility] ([facility] [address] [place]  [place], [place] [phone] or [phone]   [facility] ([facility]) [address] [place], [place] [phone] Accepts Medicaid  [place] [facility] [address],  [place] [phone] Substance Abuse/Addiction Treatment   [facility] Organization         Address  Phone  Notes  [facility]  [phone]   Angie Fava, PhD [address] Ervin Knack [place], [place]   [phone] or [phone]   [facility] Behavioral   [address] [place], [place] [phone]   [facility] [address], [place], [place] [phone] Insurance/Medicaid/sponsorship through [employer] and Families [address]  [place], [place] [phone]) [phone] Therapy/tele-psych/case  [facility] [address]   [place], [place] [phone]) [phone]    Dr. Lolly MustacheArfeen  [phone]) [phone]   [facility]  [employer] [facility] 1) [address], [place] 2) [address], [place] 3)  [address], [place] [phone](336) [phone])  [phone]  [phone]) [phone]   [facility] Child Abuse Hotline [phone]) [phone] or [phone](336) [phone] (After Hours)

## 2016-03-13 ENCOUNTER — Encounter (HOSPITAL_BASED_OUTPATIENT_CLINIC_OR_DEPARTMENT_OTHER): Payer: Self-pay | Admitting: *Deleted

## 2016-03-26 ENCOUNTER — Ambulatory Visit: Payer: Self-pay | Admitting: Specialist

## 2016-03-27 ENCOUNTER — Ambulatory Visit (HOSPITAL_BASED_OUTPATIENT_CLINIC_OR_DEPARTMENT_OTHER): Payer: BLUE CROSS/BLUE SHIELD | Admitting: Anesthesiology

## 2016-03-27 ENCOUNTER — Ambulatory Visit (HOSPITAL_BASED_OUTPATIENT_CLINIC_OR_DEPARTMENT_OTHER)
Admission: RE | Admit: 2016-03-27 | Discharge: 2016-03-27 | Disposition: A | Discharge status: Home / Self Care | Payer: BLUE CROSS/BLUE SHIELD | Source: Ambulatory Visit | Attending: Specialist | Admitting: Specialist

## 2016-03-27 ENCOUNTER — Encounter (HOSPITAL_BASED_OUTPATIENT_CLINIC_OR_DEPARTMENT_OTHER)
Admission: RE | Disposition: A | Discharge status: Home / Self Care | Payer: Self-pay | Source: Ambulatory Visit | Attending: Specialist

## 2016-03-27 ENCOUNTER — Ambulatory Visit: Payer: Self-pay | Admitting: Specialist

## 2016-03-27 ENCOUNTER — Encounter (HOSPITAL_BASED_OUTPATIENT_CLINIC_OR_DEPARTMENT_OTHER): Payer: Self-pay | Admitting: Anesthesiology

## 2016-03-27 ENCOUNTER — Other Ambulatory Visit: Payer: Self-pay | Admitting: Specialist

## 2016-03-27 DIAGNOSIS — F17210 Nicotine dependence, cigarettes, uncomplicated: Secondary | ICD-10-CM | POA: Insufficient documentation

## 2016-03-27 DIAGNOSIS — F17200 Nicotine dependence, unspecified, uncomplicated: Secondary | ICD-10-CM | POA: Insufficient documentation

## 2016-03-27 DIAGNOSIS — L732 Hidradenitis suppurativa: Secondary | ICD-10-CM | POA: Insufficient documentation

## 2016-03-27 HISTORY — PX: HYDRADENITIS EXCISION: SHX5243

## 2016-03-27 SURGERY — EXCISION, HIDRADENITIS, AXILLA
Anesthesia: General | Site: Axilla | Laterality: Right

## 2016-03-27 MED ORDER — MIDAZOLAM HCL 5 MG/5ML IJ SOLN
INTRAMUSCULAR | Status: DC | PRN
  Administered 2016-03-27: 2 mg via INTRAVENOUS

## 2016-03-27 MED ORDER — LIDOCAINE HCL (CARDIAC) 20 MG/ML IV SOLN
INTRAVENOUS | Status: DC | PRN
  Administered 2016-03-27: 50 mg via INTRAVENOUS

## 2016-03-27 MED ORDER — ONDANSETRON HCL 4 MG/2ML IJ SOLN
INTRAMUSCULAR | Status: AC
Start: 2016-03-27 — End: 2016-03-27
  Filled 2016-03-27: qty 2

## 2016-03-27 MED ORDER — LACTATED RINGERS IV SOLN
INTRAVENOUS | Status: DC
  Administered 2016-03-27 (×2): via INTRAVENOUS

## 2016-03-27 MED ORDER — MIDAZOLAM HCL 2 MG/2ML IJ SOLN
INTRAMUSCULAR | Status: AC
  Filled 2016-03-27: qty 2

## 2016-03-27 MED ORDER — MIDAZOLAM HCL 2 MG/2ML IJ SOLN: 1.0000 mg | INTRAMUSCULAR | Status: DC | PRN

## 2016-03-27 MED ORDER — LIDOCAINE 2% (20 MG/ML) 5 ML SYRINGE
INTRAMUSCULAR | Status: AC
  Filled 2016-03-27: qty 5

## 2016-03-27 MED ORDER — OXYCODONE HCL 5 MG/5ML PO SOLN: 5.0000 mg | Freq: Once | ORAL | Status: AC | PRN

## 2016-03-27 MED ORDER — FENTANYL CITRATE (PF) 100 MCG/2ML IJ SOLN
INTRAMUSCULAR | Status: AC
  Filled 2016-03-27: qty 2

## 2016-03-27 MED ORDER — ONDANSETRON HCL 4 MG/2ML IJ SOLN
INTRAMUSCULAR | Status: DC | PRN
  Administered 2016-03-27: 4 mg via INTRAVENOUS

## 2016-03-27 MED ORDER — DEXAMETHASONE SODIUM PHOSPHATE 4 MG/ML IJ SOLN
INTRAMUSCULAR | Status: DC | PRN
  Administered 2016-03-27: 10 mg via INTRAVENOUS

## 2016-03-27 MED ORDER — GLYCOPYRROLATE 0.2 MG/ML IJ SOLN: 0.2000 mg | Freq: Once | INTRAMUSCULAR | Status: DC | PRN

## 2016-03-27 MED ORDER — LIDOCAINE-EPINEPHRINE 1 %-1:100000 IJ SOLN
INTRAMUSCULAR | Status: DC | PRN
  Administered 2016-03-27: 20 mL

## 2016-03-27 MED ORDER — HYDROMORPHONE HCL 1 MG/ML IJ SOLN: 0.2500 mg | INTRAMUSCULAR | Status: DC | PRN

## 2016-03-27 MED ORDER — PROPOFOL 10 MG/ML IV BOLUS
INTRAVENOUS | Status: AC
  Filled 2016-03-27: qty 40

## 2016-03-27 MED ORDER — FENTANYL CITRATE (PF) 100 MCG/2ML IJ SOLN: 50.0000 ug | INTRAMUSCULAR | Status: DC | PRN

## 2016-03-27 MED ORDER — SODIUM CHLORIDE 0.9 % IV SOLN
INTRAVENOUS | Status: DC | PRN
  Administered 2016-03-27: 150 mL via INTRAMUSCULAR

## 2016-03-27 MED ORDER — OXYCODONE HCL 5 MG PO TABS
ORAL_TABLET | ORAL | Status: AC
  Filled 2016-03-27: qty 1

## 2016-03-27 MED ORDER — OXYCODONE HCL 5 MG PO TABS
5.0000 mg | ORAL_TABLET | Freq: Once | ORAL | Status: AC | PRN
Start: 2016-03-27 — End: 2016-03-27
  Administered 2016-03-27: 5 mg via ORAL

## 2016-03-27 MED ORDER — EPINEPHRINE HCL 1 MG/ML IJ SOLN
INTRAMUSCULAR | Status: AC
  Filled 2016-03-27: qty 1

## 2016-03-27 MED ORDER — PROPOFOL 10 MG/ML IV BOLUS
INTRAVENOUS | Status: DC | PRN
  Administered 2016-03-27: 200 mg via INTRAVENOUS

## 2016-03-27 MED ORDER — MEPERIDINE HCL 25 MG/ML IJ SOLN: 6.2500 mg | INTRAMUSCULAR | Status: DC | PRN

## 2016-03-27 MED ORDER — FENTANYL CITRATE (PF) 100 MCG/2ML IJ SOLN
INTRAMUSCULAR | Status: DC | PRN
  Administered 2016-03-27: 25 ug via INTRAVENOUS
  Administered 2016-03-27: 100 ug via INTRAVENOUS

## 2016-03-27 MED ORDER — CEFAZOLIN SODIUM-DEXTROSE 2-3 GM-% IV SOLR
INTRAVENOUS | Status: DC | PRN
  Administered 2016-03-27: 2 g via INTRAVENOUS

## 2016-03-27 MED ORDER — DEXAMETHASONE SODIUM PHOSPHATE 10 MG/ML IJ SOLN
INTRAMUSCULAR | Status: AC
  Filled 2016-03-27: qty 1

## 2016-03-27 MED ORDER — SCOPOLAMINE 1 MG/3DAYS TD PT72: 1.0000 | MEDICATED_PATCH | Freq: Once | TRANSDERMAL | Status: DC | PRN

## 2016-03-27 SURGICAL SUPPLY — 60 items
APL SKNCLS STERI-STRIP NONHPOA (GAUZE/BANDAGES/DRESSINGS)
BAG DECANTER FOR FLEXI CONT (MISCELLANEOUS) ×2 IMPLANT
BENZOIN TINCTURE PRP APPL 2/3 (GAUZE/BANDAGES/DRESSINGS) IMPLANT
BLADE KNIFE PERSONA 10 (BLADE) ×3 IMPLANT
BLADE KNIFE PERSONA 15 (BLADE) ×2 IMPLANT
BNDG COHESIVE 4X5 TAN STRL (GAUZE/BANDAGES/DRESSINGS) ×2 IMPLANT
BRIEF STRETCH FOR OB PAD LRG (UNDERPADS AND DIAPERS) IMPLANT
CANISTER SUCT 1200ML W/VALVE (MISCELLANEOUS) ×3 IMPLANT
COVER BACK TABLE 60X90IN (DRAPES) ×3 IMPLANT
COVER MAYO STAND STRL (DRAPES) ×3 IMPLANT
DECANTER SPIKE VIAL GLASS SM (MISCELLANEOUS) IMPLANT
DRAIN CHANNEL 10M FLAT 3/4 FLT (DRAIN) IMPLANT
DRAIN CHANNEL 7F FF FLAT (WOUND CARE) IMPLANT
DRAPE U-SHAPE 76X120 STRL (DRAPES) ×6 IMPLANT
DRSG PAD ABDOMINAL 8X10 ST (GAUZE/BANDAGES/DRESSINGS) ×3 IMPLANT
DRSG TELFA 3X8 NADH (GAUZE/BANDAGES/DRESSINGS) ×3 IMPLANT
ELECT REM PT RETURN 9FT ADLT (ELECTROSURGICAL) ×3
ELECTRODE REM PT RTRN 9FT ADLT (ELECTROSURGICAL) ×1 IMPLANT
EVACUATOR SILICONE 100CC (DRAIN) IMPLANT
GAUZE SPONGE 4X4 12PLY STRL (GAUZE/BANDAGES/DRESSINGS) ×3 IMPLANT
GAUZE XEROFORM 5X9 LF (GAUZE/BANDAGES/DRESSINGS) ×3 IMPLANT
GLOVE BIO SURGEON STRL SZ 6.5 (GLOVE) ×2 IMPLANT
GLOVE BIO SURGEONS STRL SZ 6.5 (GLOVE) ×1
GLOVE BIOGEL M STRL SZ7.5 (GLOVE) ×3 IMPLANT
GLOVE BIOGEL PI IND STRL 8 (GLOVE) ×1 IMPLANT
GLOVE BIOGEL PI INDICATOR 8 (GLOVE) ×2
GLOVE ECLIPSE 7.0 STRL STRAW (GLOVE) ×3 IMPLANT
GOWN STRL REUS W/ TWL XL LVL3 (GOWN DISPOSABLE) ×2 IMPLANT
GOWN STRL REUS W/TWL XL LVL3 (GOWN DISPOSABLE) ×6
IV NS 500ML (IV SOLUTION) ×3
IV NS 500ML BAXH (IV SOLUTION) ×1 IMPLANT
MARKER SKIN DUAL TIP RULER LAB (MISCELLANEOUS) ×3 IMPLANT
NDL SAFETY ECLIPSE 18X1.5 (NEEDLE) IMPLANT
NEEDLE HYPO 18GX1.5 SHARP (NEEDLE)
NEEDLE HYPO 25X1 1.5 SAFETY (NEEDLE) IMPLANT
NEEDLE SPNL 18GX3.5 QUINCKE PK (NEEDLE) ×3 IMPLANT
NS IRRIG 1000ML POUR BTL (IV SOLUTION) ×3 IMPLANT
PACK BASIN DAY SURGERY FS (CUSTOM PROCEDURE TRAY) ×3 IMPLANT
PEN SKIN MARKING BROAD TIP (MISCELLANEOUS) ×3 IMPLANT
PIN SAFETY STERILE (MISCELLANEOUS) IMPLANT
SHEET MEDIUM DRAPE 40X70 STRL (DRAPES) IMPLANT
SLEEVE SCD COMPRESS KNEE MED (MISCELLANEOUS) ×3 IMPLANT
SPONGE LAP 18X18 X RAY DECT (DISPOSABLE) ×6 IMPLANT
STAPLER VISISTAT 35W (STAPLE) IMPLANT
STOCKINETTE IMPERVIOUS LG (DRAPES) ×3 IMPLANT
STRIP SUTURE WOUND CLOSURE 1/2 (SUTURE) ×3 IMPLANT
SUT ETHILON 3 0 FSL (SUTURE) ×2 IMPLANT
SUT MNCRL AB 3-0 PS2 18 (SUTURE) ×3 IMPLANT
SUT MON AB 2-0 CT1 36 (SUTURE) ×3 IMPLANT
SUT PROLENE 4 0 P 3 18 (SUTURE) IMPLANT
SYR 20CC LL (SYRINGE) ×6 IMPLANT
SYR 50ML LL SCALE MARK (SYRINGE) IMPLANT
SYR CONTROL 10ML LL (SYRINGE) ×3 IMPLANT
TOWEL OR 17X24 6PK STRL BLUE (TOWEL DISPOSABLE) ×9 IMPLANT
TRAY DSU PREP LF (CUSTOM PROCEDURE TRAY) ×3 IMPLANT
TUBE CONNECTING 20'X1/4 (TUBING) ×1
TUBE CONNECTING 20X1/4 (TUBING) ×2 IMPLANT
UNDERPAD 30X30 (UNDERPADS AND DIAPERS) ×3 IMPLANT
VAC PENCILS W/TUBING CLEAR (MISCELLANEOUS) ×3 IMPLANT
YANKAUER SUCT BULB TIP NO VENT (SUCTIONS) ×3 IMPLANT

## 2016-03-27 NOTE — Anesthesia Postprocedure Evaluation (Signed)
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)  Patient location during evaluation: PACU Anesthesia Type: General Level of consciousness: awake and alert Pain management: pain level controlled Vital Signs Assessment: post-procedure vital signs reviewed and stable Respiratory status: spontaneous breathing, nonlabored ventilation and respiratory function stable Cardiovascular status: blood pressure returned to baseline and stable Postop Assessment: no signs of nausea or vomiting Anesthetic complications: no    Last Vitals:  Vitals:   03/27/16 1145 03/27/16 1230  BP: 115/73 114/69  Pulse: 88 86  Resp: 16 16  Temp:  36.5 C    Last Pain:  Vitals:   03/27/16 1230  TempSrc:   PainSc: 3                  Seymour Pavlak A

## 2016-03-27 NOTE — Anesthesia Procedure Notes (Signed)
Procedure Name: LMA Insertion Date/Time: 03/27/2016 10:30 AM Performed by: Genevieve Norlander L Pre-anesthesia Checklist: Patient identified, Emergency Drugs available, Suction available and Patient being monitored Patient Re-evaluated:Patient Re-evaluated prior to inductionOxygen Delivery Method: Circle system utilized Preoxygenation: Pre-oxygenation with 100% oxygen Intubation Type: IV induction Ventilation: Mask ventilation without difficulty LMA: LMA inserted LMA Size: 4.0 Number of attempts: 1 Airway Equipment and Method: Bite block Placement Confirmation: positive ETCO2 Tube secured with: Tape Dental Injury: Teeth and Oropharynx as per pre-operative assessment

## 2016-03-27 NOTE — Brief Op Note (Signed)
03/27/2016  11:32 AM  PATIENT:  Rolland Bimler  30 y.o. 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   Delay start of Pharmacological VTE agent (>24hrs) due to surgical blood loss or risk of bleeding: yes

## 2016-03-27 NOTE — Anesthesia Preprocedure Evaluation (Signed)
Anesthesia Evaluation  Patient identified by MRN, date of birth, ID band Patient awake    Reviewed: Allergy & Precautions, NPO status , Patient's Chart, lab work & pertinent test results  Airway Mallampati: I  TM Distance: >3 FB Neck ROM: Full    Dental  (+) Teeth Intact, Dental Advisory Given   Pulmonary Current Smoker,  breath sounds clear to auscultation        Cardiovascular Rhythm:Regular Rate:Normal     Neuro/Psych    GI/Hepatic   Endo/Other    Renal/GU      Musculoskeletal   Abdominal   Peds  Hematology   Anesthesia Other Findings   Reproductive/Obstetrics                             Anesthesia Physical Anesthesia Plan  ASA: I  Anesthesia Plan: General   Post-op Pain Management:    Induction: Intravenous  Airway Management Planned: Oral ETT  Additional Equipment:   Intra-op Plan:   Post-operative Plan: Extubation in OR  Informed Consent: I have reviewed the patients History and Physical, chart, labs and discussed the procedure including the risks, benefits and alternatives for the proposed anesthesia with the patient or authorized representative who has indicated his/her understanding and acceptance.   Dental advisory given  Plan Discussed with: CRNA, Anesthesiologist and Surgeon  Anesthesia Plan Comments:         Anesthesia Quick Evaluation  

## 2016-03-27 NOTE — Discharge Instructions (Signed)
Post Anesthesia Home Care Instructions  Activity: Get plenty of rest for the remainder of the day. A responsible adult should stay with you for 24 hours following the procedure.  For the next 24 hours, DO NOT: -Drive a car -Advertising copywriter -Drink alcoholic beverages -Take any medication unless instructed by your physician -Make any legal decisions or sign important papers.  Meals: Start with liquid foods such as gelatin or soup. Progress to regular foods as tolerated. Avoid greasy, spicy, heavy foods. If nausea and/or vomiting occur, drink only clear liquids until the nausea and/or vomiting subsides. Call your physician if vomiting continues.  Special Instructions/Symptoms: Your throat may feel dry or sore from the anesthesia or the breathing tube placed in your throat during surgery. If this causes discomfort, gargle with warm salt water. The discomfort should disappear within 24 hours.  If you had a scopolamine patch placed behind your ear for the management of post- operative nausea and/or vomiting:  1. The medication in the patch is effective for 72 hours, after which it should be removed.  Wrap patch in a tissue and discard in the trash. Wash hands thoroughly with soap and water. 2. You may remove the patch earlier than 72 hours if you experience unpleasant side effects which may include dry mouth, dizziness or visual disturbances. 3. Avoid touching the patch. Wash your hands with soap and water after contact with the patch.   Activity (include date of return to work if known) As tolerated: NO showers NO driving No heavy activities  Diet:regular No restrictions:  Wound Care: Keep dressing clean & dry  Do not change dressings For Abdominoplasties wear abdominal binder Special Instructions: Do not raise arms up Continue to empty, recharge, & record drainage from J-P drains &/or Hemovacs 2-3 times a day, as needed. Call Doctor if any unusual problems occur such as pain,  excessive Bleeding, unrelieved Nausea/vomiting, Fever &/or chills When lying down, keep head elevated on 2-3 pillows or back-rest For Addominoplasties the Jack-knife position Follow-up appointment: Please call the office.  The patient received discharge instruction from:___________________________________________   Patient signature ________________________________________ / Date___________    Signature of individual providing instructions/ Date________________             Post Anesthesia Home Care Instructions  Activity: Get plenty of rest for the remainder of the day. A responsible adult should stay with you for 24 hours following the procedure.  For the next 24 hours, DO NOT: -Drive a car -Advertising copywriter -Drink alcoholic beverages -Take any medication unless instructed by your physician -Make any legal decisions or sign important papers.  Meals: Start with liquid foods such as gelatin or soup. Progress to regular foods as tolerated. Avoid greasy, spicy, heavy foods. If nausea and/or vomiting occur, drink only clear liquids until the nausea and/or vomiting subsides. Call your physician if vomiting continues.  Special Instructions/Symptoms: Your throat may feel dry or sore from the anesthesia or the breathing tube placed in your throat during surgery. If this causes discomfort, gargle with warm salt water. The discomfort should disappear within 24 hours.  If you had a scopolamine patch placed behind your ear for the management of post- operative nausea and/or vomiting:  1. The medication in the patch is effective for 72 hours, after which it should be removed.  Wrap patch in a tissue and discard in the trash. Wash hands thoroughly with soap and water. 2. You may remove the patch earlier than 72 hours if you experience unpleasant side effects  which may include dry mouth, dizziness or visual disturbances. 3. Avoid touching the patch. Wash your hands with soap and water after  contact with the patch.    Call your surgeon if you experience:   1.  Fever over 101.0. 2.  Inability to urinate. 3.  Nausea and/or vomiting. 4.  Extreme swelling or bruising at the surgical site. 5.  Continued bleeding from the incision. 6.  Increased pain, redness or drainage from the incision. 7.  Problems related to your pain medication. 8.  Any problems and/or concerns

## 2016-03-27 NOTE — Transfer of Care (Signed)
Immediate Anesthesia Transfer of Care Note  Patient: Shannon Montoya  Procedure(s) Performed: Procedure(s): EXCISION  HIDRADENITIS RIGHT AXILLA RYAN POLLACK CLOSURE (Right)  Patient Location: PACU  Anesthesia Type:General  Level of Consciousness: awake and patient cooperative  Airway & Oxygen Therapy: Patient Spontanous Breathing and Patient connected to face mask oxygen  Post-op Assessment: Report given to RN and Post -op Vital signs reviewed and stable  Post vital signs: Reviewed and stable  Last Vitals:  Vitals:   03/27/16 1130 03/27/16 1131  BP:    Pulse: (!) 106 (!) 104  Resp:  16  Temp:      Last Pain:  Vitals:   03/27/16 0947  TempSrc: Oral  PainSc:          Complications: No apparent anesthesia complications

## 2016-03-27 NOTE — Anesthesia Procedure Notes (Signed)
Procedure Name: LMA Insertion Date/Time: 03/27/2016 10:30 AM Performed by: Genevieve Norlander L Pre-anesthesia Checklist: Patient identified, Emergency Drugs available, Suction available, Patient being monitored and Timeout performed Patient Re-evaluated:Patient Re-evaluated prior to inductionOxygen Delivery Method: Circle system utilized Preoxygenation: Pre-oxygenation with 100% oxygen Intubation Type: IV induction Ventilation: Mask ventilation without difficulty LMA: LMA inserted LMA Size: 4.0 Number of attempts: 1 Airway Equipment and Method: Bite block Placement Confirmation: positive ETCO2 Tube secured with: Tape Dental Injury: Teeth and Oropharynx as per pre-operative assessment

## 2016-03-27 NOTE — H&P (Signed)
Shannon Montoya in 30 y.o. female.   Chief Complaint: Severe hidraidnitis right and left axillae HPI: Increased flareups and incision and drainages done  Past Medical History:  Diagnosis Date  . Right axillary hidradenitis 03/2016   open and draining, per pt.    Past Surgical History:  Procedure Laterality Date  . NO PAST SURGERIES      Family History  Problem Relation Age of Onset  . Heart disease Mother   . Heart disease Father    Social History:  reports that she has been smoking Cigarettes.  She has been smoking about 0.00 packs per day for the past 11.00 years. She has never used smokeless tobacco. She reports that she does not drink alcohol or use drugs.  Allergies: No Known Allergies   (Not in a hospital admission)  No results found for this or any previous visit (from the past 48 hour(s)). No results found.  Review of Systems  Constitutional: Negative.   HENT: Negative.   Eyes: Negative.   Respiratory: Negative.   Cardiovascular: Negative.   Gastrointestinal: Negative.   Genitourinary: Negative.   Musculoskeletal: Negative.   Skin: Positive for rash.  Neurological: Negative.   Endo/Heme/Allergies: Negative.   Psychiatric/Behavioral: Negative.     Last menstrual period 03/06/2016. Physical Exam   Assessment/Plan Severe hidraidnitis right and left axillae for excision hidraidtis right axilla and Estill Batten closure  Rosalio Macadamia, MD 03/27/2016, 11:26 AM

## 2016-03-28 NOTE — Op Note (Signed)
NAMEBRITTINY, Shannon Montoya NO.:  [id number]  MEDICAL RECORD NO.:  [id number]  LOCATION:                                 FACILITY:  PHYSICIAN:  Earvin Hansen L. Felita Bump, M.D.DATE OF BIRTH:  [birth year]  DATE OF PROCEDURE:  03/27/2016 DATE OF DISCHARGE:                              OPERATIVE REPORT   A 30 year old with severe history of hidradenitis suppurativa, causing her to have multiple incision and drainage to its complex, right and left axillae.  The right axilla is the worst side, we were taught with that today.  All procedures in detail were explained to the patient as well as the attendant risks and possible complications.  PROCEDURES DONE:  Excision of hidradenitis of right axilla, closer complex with Penni Bombard advanced flap closure.  ANESTHESIA:  General.  Preoperatively, the patient underwent drawings for the excision of all hair-bearing area on the right axilla.  She was then placed under general anesthesia, intubated orally, prep was done to the chest, breast, axillary, right arm areas using Hibiclens soap and solution and walled off with sterile towels and drapes so as to make a sterile field. A 0.25% Xylocaine with 1:400,000 concentration was injected locally, total of 150 mL.  This was allowed to sit up and then the drawings were reinforced with a marking pen.  Then, the Bovie was used on cutting to excise all the elliptical paddle part of the excision.  Dissection was then carried down to the superficial fascia where the other tissues were dissected out delicately.  Next, the superior and inferior flaps were freed up approximately 8 cm on each side to allow advanced flap closure using deep sutures of 2-0 Monocryl x2 layers and then a running subcuticular stitch of 3-0 Monocryl.  This was then reinforced with a running 3-0 nylon suture.  Steri-Strips and soft dressing were applied to all areas.  She withstood the procedures very well, was taken to  the recovery in good condition.  Dressings of 4x4s, ABDs, Hypafix tape.     Yaakov Guthrie. Shon Hough, M.D.   ______________________________ Yaakov Guthrie. Shon Hough, M.D.    Cathie Hoops  D:  03/27/2016  T:  03/27/2016  Job:  [id number]

## 2016-03-30 ENCOUNTER — Encounter (HOSPITAL_BASED_OUTPATIENT_CLINIC_OR_DEPARTMENT_OTHER): Payer: Self-pay | Admitting: Specialist

## 2016-05-05 DIAGNOSIS — L732 Hidradenitis suppurativa: Secondary | ICD-10-CM

## 2016-05-05 HISTORY — DX: Hidradenitis suppurativa: L73.2

## 2016-05-30 ENCOUNTER — Encounter (HOSPITAL_BASED_OUTPATIENT_CLINIC_OR_DEPARTMENT_OTHER): Payer: Self-pay | Admitting: *Deleted

## 2016-06-04 ENCOUNTER — Ambulatory Visit: Payer: Self-pay | Admitting: Specialist

## 2016-06-05 ENCOUNTER — Encounter (HOSPITAL_BASED_OUTPATIENT_CLINIC_OR_DEPARTMENT_OTHER): Payer: Self-pay | Admitting: Anesthesiology

## 2016-06-05 ENCOUNTER — Ambulatory Visit (HOSPITAL_BASED_OUTPATIENT_CLINIC_OR_DEPARTMENT_OTHER): Payer: BLUE CROSS/BLUE SHIELD | Admitting: Anesthesiology

## 2016-06-05 ENCOUNTER — Ambulatory Visit (HOSPITAL_BASED_OUTPATIENT_CLINIC_OR_DEPARTMENT_OTHER)
Admission: RE | Admit: 2016-06-05 | Discharge: 2016-06-05 | Disposition: A | Discharge status: Home / Self Care | Payer: BLUE CROSS/BLUE SHIELD | Source: Ambulatory Visit | Attending: Specialist | Admitting: Specialist

## 2016-06-05 ENCOUNTER — Encounter (HOSPITAL_BASED_OUTPATIENT_CLINIC_OR_DEPARTMENT_OTHER)
Admission: RE | Disposition: A | Discharge status: Home / Self Care | Payer: Self-pay | Source: Ambulatory Visit | Attending: Specialist

## 2016-06-05 DIAGNOSIS — F17210 Nicotine dependence, cigarettes, uncomplicated: Secondary | ICD-10-CM | POA: Diagnosis not present

## 2016-06-05 DIAGNOSIS — L732 Hidradenitis suppurativa: Secondary | ICD-10-CM | POA: Diagnosis not present

## 2016-06-05 HISTORY — PX: HYDRADENITIS EXCISION: SHX5243

## 2016-06-05 HISTORY — DX: Hidradenitis suppurativa: L73.2

## 2016-06-05 SURGERY — EXCISION, HIDRADENITIS, AXILLA
Anesthesia: General | Site: Axilla | Laterality: Left

## 2016-06-05 MED ORDER — FENTANYL CITRATE (PF) 100 MCG/2ML IJ SOLN
INTRAMUSCULAR | Status: AC
  Filled 2016-06-05: qty 2

## 2016-06-05 MED ORDER — DEXAMETHASONE SODIUM PHOSPHATE 10 MG/ML IJ SOLN
INTRAMUSCULAR | Status: AC
  Filled 2016-06-05: qty 1

## 2016-06-05 MED ORDER — LIDOCAINE-EPINEPHRINE (PF) 1 %-1:200000 IJ SOLN
INTRAMUSCULAR | Status: DC | PRN
  Administered 2016-06-05: 30 mL

## 2016-06-05 MED ORDER — CEFAZOLIN SODIUM-DEXTROSE 2-4 GM/100ML-% IV SOLN
2.0000 g | INTRAVENOUS | Status: AC
  Administered 2016-06-05: 2 g via INTRAVENOUS

## 2016-06-05 MED ORDER — DEXAMETHASONE SODIUM PHOSPHATE 4 MG/ML IJ SOLN
INTRAMUSCULAR | Status: DC | PRN
  Administered 2016-06-05: 10 mg via INTRAVENOUS

## 2016-06-05 MED ORDER — PROPOFOL 10 MG/ML IV BOLUS
INTRAVENOUS | Status: DC | PRN
  Administered 2016-06-05: 150 mg via INTRAVENOUS

## 2016-06-05 MED ORDER — LACTATED RINGERS IV SOLN
INTRAVENOUS | Status: DC
  Administered 2016-06-05: 09:00:00 via INTRAVENOUS

## 2016-06-05 MED ORDER — LIDOCAINE 2% (20 MG/ML) 5 ML SYRINGE
INTRAMUSCULAR | Status: AC
  Filled 2016-06-05: qty 5

## 2016-06-05 MED ORDER — CEFAZOLIN SODIUM-DEXTROSE 2-4 GM/100ML-% IV SOLN
INTRAVENOUS | Status: AC
  Filled 2016-06-05: qty 100

## 2016-06-05 MED ORDER — LIDOCAINE 2% (20 MG/ML) 5 ML SYRINGE
INTRAMUSCULAR | Status: DC | PRN
  Administered 2016-06-05: 80 mg via INTRAVENOUS

## 2016-06-05 MED ORDER — PROMETHAZINE HCL 25 MG/ML IJ SOLN: 6.2500 mg | INTRAMUSCULAR | Status: DC | PRN

## 2016-06-05 MED ORDER — GLYCOPYRROLATE 0.2 MG/ML IJ SOLN
0.2000 mg | Freq: Once | INTRAMUSCULAR | Status: DC | PRN
Start: 2016-06-05 — End: 2016-06-05

## 2016-06-05 MED ORDER — CHLORHEXIDINE GLUCONATE CLOTH 2 % EX PADS: 6.0000 | MEDICATED_PAD | Freq: Once | CUTANEOUS | Status: DC

## 2016-06-05 MED ORDER — OXYCODONE HCL 5 MG PO TABS
ORAL_TABLET | ORAL | Status: AC
  Filled 2016-06-05: qty 1

## 2016-06-05 MED ORDER — ONDANSETRON HCL 4 MG/2ML IJ SOLN
INTRAMUSCULAR | Status: AC
  Filled 2016-06-05: qty 2

## 2016-06-05 MED ORDER — OXYCODONE HCL 5 MG PO TABS
5.0000 mg | ORAL_TABLET | Freq: Once | ORAL | Status: AC | PRN
  Administered 2016-06-05: 5 mg via ORAL

## 2016-06-05 MED ORDER — SODIUM CHLORIDE 0.9 % IV SOLN
INTRAVENOUS | Status: DC | PRN
  Administered 2016-06-05: 100 mL via INTRAMUSCULAR

## 2016-06-05 MED ORDER — MIDAZOLAM HCL 2 MG/2ML IJ SOLN
1.0000 mg | INTRAMUSCULAR | Status: DC | PRN
  Administered 2016-06-05: 2 mg via INTRAVENOUS

## 2016-06-05 MED ORDER — FENTANYL CITRATE (PF) 100 MCG/2ML IJ SOLN
25.0000 ug | INTRAMUSCULAR | Status: DC | PRN
  Administered 2016-06-05 (×3): 50 ug via INTRAVENOUS

## 2016-06-05 MED ORDER — FENTANYL CITRATE (PF) 100 MCG/2ML IJ SOLN
50.0000 ug | INTRAMUSCULAR | Status: DC | PRN
  Administered 2016-06-05: 100 ug via INTRAVENOUS
  Administered 2016-06-05: 50 ug via INTRAVENOUS

## 2016-06-05 MED ORDER — MIDAZOLAM HCL 2 MG/2ML IJ SOLN
INTRAMUSCULAR | Status: AC
  Filled 2016-06-05: qty 2

## 2016-06-05 MED ORDER — ONDANSETRON HCL 4 MG/2ML IJ SOLN
INTRAMUSCULAR | Status: DC | PRN
  Administered 2016-06-05: 4 mg via INTRAVENOUS

## 2016-06-05 MED ORDER — SCOPOLAMINE 1 MG/3DAYS TD PT72
1.0000 | MEDICATED_PATCH | Freq: Once | TRANSDERMAL | Status: DC | PRN
Start: 2016-06-05 — End: 2016-06-05

## 2016-06-05 SURGICAL SUPPLY — 64 items
APL SKNCLS STERI-STRIP NONHPOA (GAUZE/BANDAGES/DRESSINGS) ×1
BAG DECANTER FOR FLEXI CONT (MISCELLANEOUS) IMPLANT
BENZOIN TINCTURE PRP APPL 2/3 (GAUZE/BANDAGES/DRESSINGS) ×2 IMPLANT
BLADE KNIFE PERSONA 10 (BLADE) ×3 IMPLANT
BLADE KNIFE PERSONA 15 (BLADE) ×2 IMPLANT
BNDG COHESIVE 4X5 TAN STRL (GAUZE/BANDAGES/DRESSINGS) ×2 IMPLANT
BRIEF STRETCH FOR OB PAD LRG (UNDERPADS AND DIAPERS) IMPLANT
CANISTER SUCT 1200ML W/VALVE (MISCELLANEOUS) ×3 IMPLANT
COVER BACK TABLE 60X90IN (DRAPES) ×3 IMPLANT
COVER MAYO STAND STRL (DRAPES) ×3 IMPLANT
DECANTER SPIKE VIAL GLASS SM (MISCELLANEOUS) ×2 IMPLANT
DRAIN CHANNEL 10M FLAT 3/4 FLT (DRAIN) IMPLANT
DRAIN CHANNEL 7F FF FLAT (WOUND CARE) IMPLANT
DRAPE U-SHAPE 76X120 STRL (DRAPES) ×4 IMPLANT
DRSG PAD ABDOMINAL 8X10 ST (GAUZE/BANDAGES/DRESSINGS) ×3 IMPLANT
DRSG TELFA 3X8 NADH (GAUZE/BANDAGES/DRESSINGS) IMPLANT
ELECT REM PT RETURN 9FT ADLT (ELECTROSURGICAL) ×3
ELECTRODE REM PT RTRN 9FT ADLT (ELECTROSURGICAL) ×1 IMPLANT
EVACUATOR SILICONE 100CC (DRAIN) IMPLANT
GAUZE SPONGE 4X4 12PLY STRL (GAUZE/BANDAGES/DRESSINGS) ×3 IMPLANT
GAUZE XEROFORM 5X9 LF (GAUZE/BANDAGES/DRESSINGS) ×3 IMPLANT
GLOVE BIO SURGEON STRL SZ 6.5 (GLOVE) ×2 IMPLANT
GLOVE BIO SURGEONS STRL SZ 6.5 (GLOVE) ×1
GLOVE BIOGEL M STRL SZ7.5 (GLOVE) ×3 IMPLANT
GLOVE BIOGEL PI IND STRL 8 (GLOVE) ×1 IMPLANT
GLOVE BIOGEL PI INDICATOR 8 (GLOVE) ×2
GLOVE ECLIPSE 7.0 STRL STRAW (GLOVE) ×3 IMPLANT
GOWN STRL REUS W/ TWL XL LVL3 (GOWN DISPOSABLE) ×2 IMPLANT
GOWN STRL REUS W/TWL XL LVL3 (GOWN DISPOSABLE) ×6
IV NS 500ML (IV SOLUTION) ×3
IV NS 500ML BAXH (IV SOLUTION) ×1 IMPLANT
MARKER SKIN DUAL TIP RULER LAB (MISCELLANEOUS) ×3 IMPLANT
NDL HYPO 25X1 1.5 SAFETY (NEEDLE) IMPLANT
NDL SAFETY ECLIPSE 18X1.5 (NEEDLE) IMPLANT
NDL SPNL 18GX3.5 QUINCKE PK (NEEDLE) IMPLANT
NEEDLE HYPO 18GX1.5 SHARP (NEEDLE) ×3
NEEDLE HYPO 25X1 1.5 SAFETY (NEEDLE) IMPLANT
NEEDLE SPNL 18GX3.5 QUINCKE PK (NEEDLE) ×3 IMPLANT
NS IRRIG 1000ML POUR BTL (IV SOLUTION) ×3 IMPLANT
PACK BASIN DAY SURGERY FS (CUSTOM PROCEDURE TRAY) ×3 IMPLANT
PAD DRESSING TELFA 3X8 NADH (GAUZE/BANDAGES/DRESSINGS) ×1 IMPLANT
PEN SKIN MARKING BROAD TIP (MISCELLANEOUS) ×3 IMPLANT
PIN SAFETY STERILE (MISCELLANEOUS) IMPLANT
SHEET MEDIUM DRAPE 40X70 STRL (DRAPES) ×3 IMPLANT
SHEETING SILICONE GEL EPI DERM (MISCELLANEOUS) ×3 IMPLANT
SLEEVE SCD COMPRESS KNEE MED (MISCELLANEOUS) ×3 IMPLANT
SPONGE LAP 18X18 X RAY DECT (DISPOSABLE) ×6 IMPLANT
STAPLER VISISTAT 35W (STAPLE) IMPLANT
STOCKINETTE IMPERVIOUS LG (DRAPES) IMPLANT
STRIP SUTURE WOUND CLOSURE 1/2 (SUTURE) ×3 IMPLANT
SUT ETHILON 3 0 FSL (SUTURE) ×3 IMPLANT
SUT MNCRL AB 3-0 PS2 18 (SUTURE) ×3 IMPLANT
SUT MON AB 2-0 CT1 36 (SUTURE) ×3 IMPLANT
SUT PROLENE 4 0 P 3 18 (SUTURE) IMPLANT
SYR 20CC LL (SYRINGE) IMPLANT
SYR 50ML LL SCALE MARK (SYRINGE) ×2 IMPLANT
SYR CONTROL 10ML LL (SYRINGE) ×3 IMPLANT
TOWEL OR 17X24 6PK STRL BLUE (TOWEL DISPOSABLE) ×9 IMPLANT
TRAY DSU PREP LF (CUSTOM PROCEDURE TRAY) ×3 IMPLANT
TUBE CONNECTING 20'X1/4 (TUBING) ×1
TUBE CONNECTING 20X1/4 (TUBING) ×2 IMPLANT
UNDERPAD 30X30 (UNDERPADS AND DIAPERS) ×5 IMPLANT
VAC PENCILS W/TUBING CLEAR (MISCELLANEOUS) ×3 IMPLANT
YANKAUER SUCT BULB TIP NO VENT (SUCTIONS) ×3 IMPLANT

## 2016-06-05 NOTE — Transfer of Care (Signed)
Immediate Anesthesia Transfer of Care Note  Patient: Rolland BimlerJocelyn C Yeomans  Procedure(s) Performed: Procedure(s): EXCISION HIDRADENITIS left AXILLA with ryan pollock closure (Left)  Patient Location: PACU  Anesthesia Type:General  Level of Consciousness: awake and sedated  Airway & Oxygen Therapy: Patient Spontanous Breathing and Patient connected to face mask oxygen  Post-op Assessment: Report given to RN and Post -op Vital signs reviewed and stable  Post vital signs: Reviewed and stable  Last Vitals:  Vitals:   06/05/16 0859  BP: 121/79  Pulse: 90  Resp: 18  Temp: 37.1 C    Last Pain:  Vitals:   06/05/16 0859  TempSrc: Oral  PainSc:          Complications: No apparent anesthesia complications

## 2016-06-05 NOTE — Discharge Instructions (Signed)
Activity (include date of return to work if known) °As tolerated: NO showers °NO driving °No heavy activities ° °Diet:regular No restrictions: ° °Wound Care: Keep dressing clean & dry ° °Do not change dressings °For Abdominoplasties wear abdominal binder °Special Instructions: °Do not raise arms up °Continue to empty, recharge, & record drainage from J-P drains &/or °Hemovacs 2-3 times a day, as needed. °Call Doctor if any unusual problems occur such as pain, excessive °Bleeding, unrelieved Nausea/vomiting, Fever &/or chills °When lying down, keep head elevated on 2-3 pillows or back-rest °For Addominoplasties the Jack-knife position °Follow-up appointment: Please call the office. ° °The patient received discharge instruction from:___________________________________________ ° ° °Patient signature ________________________________________ / Date___________ ° ° ° °Signature of individual providing instructions/ Date________________     ° ° °Post Anesthesia Home Care Instructions ° °Activity: °Get plenty of rest for the remainder of the day. A responsible adult should stay with you for 24 hours following the procedure.  °For the next 24 hours, DO NOT: °-Drive a car °-Operate machinery °-Drink alcoholic beverages °-Take any medication unless instructed by your physician °-Make any legal decisions or sign important papers. ° °Meals: °Start with liquid foods such as gelatin or soup. Progress to regular foods as tolerated. Avoid greasy, spicy, heavy foods. If nausea and/or vomiting occur, drink only clear liquids until the nausea and/or vomiting subsides. Call your physician if vomiting continues. ° °Special Instructions/Symptoms: °Your throat may feel dry or sore from the anesthesia or the breathing tube placed in your throat during surgery. If this causes discomfort, gargle with warm salt water. The discomfort should disappear within 24 hours. ° °If you had a scopolamine patch placed behind your ear for the management of  post- operative nausea and/or vomiting: ° °1. The medication in the patch is effective for 72 hours, after which it should be removed.  Wrap patch in a tissue and discard in the trash. Wash hands thoroughly with soap and water. °2. You may remove the patch earlier than 72 hours if you experience unpleasant side effects which may include dry mouth, dizziness or visual disturbances. °3. Avoid touching the patch. Wash your hands with soap and water after contact with the patch. °  °         °

## 2016-06-05 NOTE — Anesthesia Preprocedure Evaluation (Signed)
Anesthesia Evaluation  Patient identified by MRN, date of birth, ID band Patient awake    Reviewed: Allergy & Precautions, NPO status , Patient's Chart, lab work & pertinent test results  Airway Mallampati: I  TM Distance: >3 FB Neck ROM: Full    Dental  (+) Teeth Intact, Dental Advisory Given   Pulmonary Current Smoker,    breath sounds clear to auscultation       Cardiovascular negative cardio ROS   Rhythm:Regular Rate:Normal     Neuro/Psych negative neurological ROS     GI/Hepatic negative GI ROS, Neg liver ROS,   Endo/Other  negative endocrine ROS  Renal/GU negative Renal ROS     Musculoskeletal   Abdominal   Peds  Hematology negative hematology ROS (+)   Anesthesia Other Findings   Reproductive/Obstetrics                             Anesthesia Physical  Anesthesia Plan  ASA: I  Anesthesia Plan: General   Post-op Pain Management:    Induction: Intravenous  Airway Management Planned: LMA  Additional Equipment:   Intra-op Plan:   Post-operative Plan: Extubation in OR  Informed Consent: I have reviewed the patients History and Physical, chart, labs and discussed the procedure including the risks, benefits and alternatives for the proposed anesthesia with the patient or authorized representative who has indicated his/her understanding and acceptance.   Dental advisory given  Plan Discussed with: CRNA, Anesthesiologist and Surgeon  Anesthesia Plan Comments:         Anesthesia Quick Evaluation

## 2016-06-05 NOTE — Anesthesia Postprocedure Evaluation (Signed)
Anesthesia Post Note  Patient: Rolland BimlerJocelyn C Falor  Procedure(s) Performed: Procedure(s) (LRB): EXCISION HIDRADENITIS left AXILLA with ryan pollock closure (Left)  Patient location during evaluation: PACU Anesthesia Type: General Level of consciousness: awake and alert Pain management: pain level controlled Vital Signs Assessment: post-procedure vital signs reviewed and stable Respiratory status: spontaneous breathing, nonlabored ventilation, respiratory function stable and patient connected to nasal cannula oxygen Cardiovascular status: blood pressure returned to baseline and stable Postop Assessment: no signs of nausea or vomiting Anesthetic complications: no    Last Vitals:  Vitals:   06/05/16 1013 06/05/16 1015  BP: 124/73 118/69  Pulse: (!) 117 (!) 105  Resp:  20  Temp:  36.9 C    Last Pain:  Vitals:   06/05/16 1030  TempSrc:   PainSc: 5                  Antoniette Peake Astrid DivineS Devyne Hauger

## 2016-06-05 NOTE — H&P (Signed)
Shannon BimlerJocelyn C Montoya is an 30 y.o. female.   Chief Complaint: Severe hidraidnitis left axilla HPI: Multiple drainage and infections requiring such  Past Medical History:  Diagnosis Date  . Left axillary hidradenitis 05/2016    Past Surgical History:  Procedure Laterality Date  . HYDRADENITIS EXCISION Right 03/27/2016   Procedure: EXCISION  HIDRADENITIS RIGHT AXILLA Shannon Montoya CLOSURE;  Surgeon: Louisa SecondGerald Tannie Koskela, MD;  Location: [facility];  Service: Plastics;  Laterality: Right;    Family History  Problem Relation Age of Onset  . Heart disease Mother   . Heart disease Father    Social History:  reports that she has been smoking Cigarettes.  She has been smoking about 0.00 packs per day for the past 11.00 years. She has never used smokeless tobacco. She reports that she does not drink alcohol or use drugs.  Allergies: No Known Allergies  No prescriptions prior to admission.    No results found for this or any previous visit (from the past 48 hour(s)). No results found.  Review of Systems  Constitutional: Negative.   HENT: Negative.   Eyes: Negative.   Respiratory: Negative.   Cardiovascular: Negative.   Gastrointestinal: Negative.   Genitourinary: Negative.   Musculoskeletal: Negative.   Skin: Positive for itching and rash.  Neurological: Negative.   Endo/Heme/Allergies: Negative.   Psychiatric/Behavioral: Negative.     Height 5\' 5"  (1.651 m), weight 72.6 kg (160 lb), last menstrual period 05/07/2016. Physical Exam   Assessment/Plan Severe hidraidnitis left axilla for excision and Shannon Montoya closure  Shannon Montoya,Shannon Marple L, MD 06/05/2016, 8:38 AM

## 2016-06-05 NOTE — Brief Op Note (Signed)
06/05/2016  10:03 AM  PATIENT:  Rolland BimlerJocelyn C Raffel  30 y.o. female  PRE-OPERATIVE DIAGNOSIS:  HIDRADENITIS LEFT AXILLA  POST-OPERATIVE DIAGNOSIS:  HIDRADENITIS LEFT AXILLA  PROCEDURE:  Procedure(s): EXCISION HIDRADENITIS left AXILLA with ryan pollock closure (Left)  SURGEON:  Surgeon(s) and Role:    * Louisa SecondGerald Sherril Shipman, MD - Primary  PHYSICIAN ASSISTANT:   ASSISTANTS: none   ANESTHESIA:   general  EBL:  Total I/O In: 1300 [I.V.:1300] Out: 10 [Blood:10]  BLOOD ADMINISTERED:none  DRAINS: none   LOCAL MEDICATIONS USED:  LIDOCAINE   SPECIMEN:  Excision  DISPOSITION OF SPECIMEN:  PATHOLOGY  COUNTS:  YES  TOURNIQUET:  * No tourniquets in log *  DICTATION: .Other Dictation: Dictation Number [id number]  PLAN OF CARE: Discharge to home after PACU  PATIENT DISPOSITION:  PACU - hemodynamically stable.   Delay start of Pharmacological VTE agent (>24hrs) due to surgical blood loss or risk of bleeding: yes

## 2016-06-05 NOTE — Anesthesia Procedure Notes (Signed)
Procedure Name: LMA Insertion Performed by: Maleyah Evans W Pre-anesthesia Checklist: Patient identified, Emergency Drugs available, Suction available and Patient being monitored Patient Re-evaluated:Patient Re-evaluated prior to inductionOxygen Delivery Method: Circle system utilized Preoxygenation: Pre-oxygenation with 100% oxygen Intubation Type: IV induction Ventilation: Mask ventilation without difficulty LMA: LMA inserted LMA Size: 4.0 Number of attempts: 1 Placement Confirmation: positive ETCO2 Tube secured with: Tape Dental Injury: Teeth and Oropharynx as per pre-operative assessment        

## 2016-06-06 NOTE — Op Note (Signed)
NAMViona Montoya:  Giarrusso, Latreshia               ACCOUNT NO.:  [id number]  MEDICAL RECORD NO.:  [id number]  LOCATION:                                 FACILITY:  PHYSICIAN:  Earvin HansenGerald L. Shon Houghruesdale, M.D.DATE OF BIRTH:  June 01, 1986  DATE OF PROCEDURE:  06/05/2016 DATE OF DISCHARGE:                              OPERATIVE REPORT   A 30 year old lady with severe hidradenitis suppurativa involving the right and left axillary areas, already did the right side.  We are planning on doing the left side today with Estill Battenyan Pollack closure.  All procedure in detail were explained to the patient preoperatively. Severe hidradenitis involving the left axilla.  The patient was taken to the operating room, placed on the operating room table in the supine position, was given adequate general anesthesia, intubated orally, elliptical drawings were made to include all hair-bearing area of the whole left axilla, 1/3rd percent Xylocaine with epinephrine was injected locally into the area, total of 100 mL 1/3rd percent with epi.  This was allowed to set up and then excision was made on the borders of the mass and the infection using Bovie unit on cutting down to underlying superficial fascia.  After proper hemostasis, the flap was removed entirely.  Next, the superior and inferior portion of the flaps were freed up approximately 8 cm to allow closure without tension, a rotational flap closure.  Deep sutures of 2-0 Monocryl x2 layers subcutaneously and subdermal suture of 3-0 Monocryl in a running subcuticular stitch of 3-0 Monocryl supplemented by a running #3-0 nylon without tension.  The wounds were cleansed.  Steri-Strips and soft dressings were applied in all areas including silicone gel patches, 4x4s, ABDs, Hypafix tape, and Ace wraps.  She tolerated the procedures very well and was taken to Recovery in excellent condition.     Yaakov GuthrieGerald L. Shon Houghruesdale, M.D.   ______________________________ Yaakov GuthrieGerald L. Shon Houghruesdale,  M.D.    Cathie HoopsGLT/MEDQ  D:  06/05/2016  T:  06/06/2016  Job:  [id number]

## 2016-06-07 ENCOUNTER — Encounter (HOSPITAL_BASED_OUTPATIENT_CLINIC_OR_DEPARTMENT_OTHER): Payer: Self-pay | Admitting: Specialist

## 2017-09-04 NOTE — L&D Delivery Note (Signed)
Delivery Note At 1:31 AM a viable and healthy female was delivered via Vaginal, Spontaneous (Presentationvtx;  LOA).  APGAR: 7, 9; weight  pending.   Placenta status: spontaneous, intact to path, .  Cord: CAN x 1 not reducible, clamped , cut  with the following complications: .  Cord pH: none  Anesthesia: epidural  Episiotomy: None Lacerations: None Suture Repair: n/a Est. Blood Loss (mL): 208  Mom to postpartum.  Baby to Couplet care / Skin to Skin.  Shannon Montoya 06/16/2018, 2:22 AM

## 2017-12-12 LAB — OB RESULTS CONSOLE HEPATITIS B SURFACE ANTIGEN: Hepatitis B Surface Ag: NEGATIVE

## 2017-12-12 LAB — OB RESULTS CONSOLE ABO/RH: RH Type: POSITIVE

## 2017-12-12 LAB — OB RESULTS CONSOLE RPR: RPR: NONREACTIVE

## 2017-12-12 LAB — OB RESULTS CONSOLE GC/CHLAMYDIA
Chlamydia: NEGATIVE
Gonorrhea: NEGATIVE

## 2017-12-12 LAB — OB RESULTS CONSOLE HIV ANTIBODY (ROUTINE TESTING): HIV: NONREACTIVE

## 2017-12-12 LAB — OB RESULTS CONSOLE ANTIBODY SCREEN: Antibody Screen: NEGATIVE

## 2017-12-12 LAB — OB RESULTS CONSOLE RUBELLA ANTIBODY, IGM: RUBELLA: NON-IMMUNE/NOT IMMUNE

## 2018-05-20 LAB — OB RESULTS CONSOLE GBS: GBS: POSITIVE

## 2018-06-10 ENCOUNTER — Telehealth (HOSPITAL_COMMUNITY): Payer: Self-pay | Admitting: *Deleted

## 2018-06-10 ENCOUNTER — Encounter (HOSPITAL_COMMUNITY): Payer: Self-pay | Admitting: *Deleted

## 2018-06-10 NOTE — Telephone Encounter (Signed)
Preadmission screen  

## 2018-06-11 ENCOUNTER — Other Ambulatory Visit: Payer: Self-pay | Admitting: Obstetrics and Gynecology

## 2018-06-15 ENCOUNTER — Encounter (HOSPITAL_COMMUNITY): Payer: Self-pay

## 2018-06-15 ENCOUNTER — Inpatient Hospital Stay (HOSPITAL_COMMUNITY)
Admission: RE | Admit: 2018-06-15 | Discharge: 2018-06-17 | DRG: 807 | Disposition: A | Discharge status: Home / Self Care | Payer: BLUE CROSS/BLUE SHIELD | Attending: Obstetrics and Gynecology | Admitting: Obstetrics and Gynecology

## 2018-06-15 ENCOUNTER — Other Ambulatory Visit: Payer: Self-pay

## 2018-06-15 ENCOUNTER — Inpatient Hospital Stay (HOSPITAL_COMMUNITY): Payer: BLUE CROSS/BLUE SHIELD | Admitting: Anesthesiology

## 2018-06-15 DIAGNOSIS — F17210 Nicotine dependence, cigarettes, uncomplicated: Secondary | ICD-10-CM | POA: Diagnosis present

## 2018-06-15 DIAGNOSIS — O99334 Smoking (tobacco) complicating childbirth: Secondary | ICD-10-CM | POA: Diagnosis present

## 2018-06-15 DIAGNOSIS — Z3490 Encounter for supervision of normal pregnancy, unspecified, unspecified trimester: Secondary | ICD-10-CM | POA: Diagnosis present

## 2018-06-15 DIAGNOSIS — Z3A39 39 weeks gestation of pregnancy: Secondary | ICD-10-CM | POA: Diagnosis not present

## 2018-06-15 DIAGNOSIS — O99824 Streptococcus B carrier state complicating childbirth: Secondary | ICD-10-CM | POA: Diagnosis present

## 2018-06-15 DIAGNOSIS — O26893 Other specified pregnancy related conditions, third trimester: Secondary | ICD-10-CM | POA: Diagnosis present

## 2018-06-15 LAB — TYPE AND SCREEN
ABO/RH(D): AB POS
ANTIBODY SCREEN: NEGATIVE

## 2018-06-15 LAB — CBC
HCT: 32 % — ABNORMAL LOW (ref 36.0–46.0)
Hemoglobin: 11 g/dL — ABNORMAL LOW (ref 12.0–15.0)
MCH: 32.6 pg (ref 26.0–34.0)
MCHC: 34.4 g/dL (ref 30.0–36.0)
MCV: 95 fL (ref 80.0–100.0)
NRBC: 0 % (ref 0.0–0.2)
PLATELETS: 247 10*3/uL (ref 150–400)
RBC: 3.37 MIL/uL — AB (ref 3.87–5.11)
RDW: 13.8 % (ref 11.5–15.5)
WBC: 16 10*3/uL — AB (ref 4.0–10.5)

## 2018-06-15 LAB — ABO/RH: ABO/RH(D): AB POS

## 2018-06-15 MED ORDER — BUTORPHANOL TARTRATE 2 MG/ML IJ SOLN
2.0000 mg | INTRAMUSCULAR | Status: DC | PRN
  Administered 2018-06-15 (×2): 2 mg via INTRAVENOUS
  Filled 2018-06-15 (×2): qty 2

## 2018-06-15 MED ORDER — OXYTOCIN 40 UNITS IN LACTATED RINGERS INFUSION - SIMPLE MED: 2.5000 [IU]/h | INTRAVENOUS | Status: DC

## 2018-06-15 MED ORDER — FENTANYL 2.5 MCG/ML BUPIVACAINE 1/10 % EPIDURAL INFUSION (WH - ANES)
INTRAMUSCULAR | Status: AC
  Filled 2018-06-15: qty 100

## 2018-06-15 MED ORDER — MISOPROSTOL 50MCG HALF TABLET
50.0000 ug | ORAL_TABLET | ORAL | Status: DC
  Administered 2018-06-15 (×2): 50 ug via ORAL
  Filled 2018-06-15 (×2): qty 1

## 2018-06-15 MED ORDER — ACETAMINOPHEN 325 MG PO TABS: 650.0000 mg | ORAL_TABLET | ORAL | Status: DC | PRN

## 2018-06-15 MED ORDER — PHENYLEPHRINE 40 MCG/ML (10ML) SYRINGE FOR IV PUSH (FOR BLOOD PRESSURE SUPPORT)
80.0000 ug | PREFILLED_SYRINGE | INTRAVENOUS | Status: DC | PRN
  Filled 2018-06-15: qty 5

## 2018-06-15 MED ORDER — PHENYLEPHRINE 40 MCG/ML (10ML) SYRINGE FOR IV PUSH (FOR BLOOD PRESSURE SUPPORT): 80.0000 ug | PREFILLED_SYRINGE | INTRAVENOUS | Status: DC | PRN

## 2018-06-15 MED ORDER — PHENYLEPHRINE 40 MCG/ML (10ML) SYRINGE FOR IV PUSH (FOR BLOOD PRESSURE SUPPORT)
PREFILLED_SYRINGE | INTRAVENOUS | Status: AC
  Filled 2018-06-15: qty 20

## 2018-06-15 MED ORDER — EPHEDRINE 5 MG/ML INJ
10.0000 mg | INTRAVENOUS | Status: DC | PRN
  Filled 2018-06-15: qty 2

## 2018-06-15 MED ORDER — LIDOCAINE HCL (PF) 1 % IJ SOLN
INTRAMUSCULAR | Status: DC | PRN
  Administered 2018-06-15: 5 mL via EPIDURAL

## 2018-06-15 MED ORDER — LACTATED RINGERS AMNIOINFUSION
INTRAVENOUS | Status: DC
  Administered 2018-06-15: 23:00:00 via INTRAUTERINE
  Filled 2018-06-15 (×2): qty 1000

## 2018-06-15 MED ORDER — EPHEDRINE 5 MG/ML INJ: 10.0000 mg | INTRAVENOUS | Status: DC | PRN

## 2018-06-15 MED ORDER — FENTANYL 2.5 MCG/ML BUPIVACAINE 1/10 % EPIDURAL INFUSION (WH - ANES)
14.0000 mL/h | INTRAMUSCULAR | Status: DC | PRN
  Administered 2018-06-15 (×2): 14 mL/h via EPIDURAL
  Filled 2018-06-15: qty 100

## 2018-06-15 MED ORDER — LACTATED RINGERS IV SOLN: 500.0000 mL | INTRAVENOUS | Status: DC | PRN

## 2018-06-15 MED ORDER — OXYTOCIN BOLUS FROM INFUSION: 500.0000 mL | Freq: Once | INTRAVENOUS | Status: DC

## 2018-06-15 MED ORDER — OXYTOCIN 40 UNITS IN LACTATED RINGERS INFUSION - SIMPLE MED
1.0000 m[IU]/min | INTRAVENOUS | Status: DC
  Administered 2018-06-15: 4 m[IU]/min via INTRAVENOUS
  Filled 2018-06-15 (×2): qty 1000

## 2018-06-15 MED ORDER — DIPHENHYDRAMINE HCL 50 MG/ML IJ SOLN: 12.5000 mg | INTRAMUSCULAR | Status: DC | PRN

## 2018-06-15 MED ORDER — LIDOCAINE HCL (PF) 1 % IJ SOLN
30.0000 mL | INTRAMUSCULAR | Status: DC | PRN
  Filled 2018-06-15: qty 30

## 2018-06-15 MED ORDER — SOD CITRATE-CITRIC ACID 500-334 MG/5ML PO SOLN: 30.0000 mL | ORAL | Status: DC | PRN

## 2018-06-15 MED ORDER — PENICILLIN G 3 MILLION UNITS IVPB - SIMPLE MED
3.0000 10*6.[IU] | INTRAVENOUS | Status: DC
  Administered 2018-06-15 – 2018-06-16 (×4): 3 10*6.[IU] via INTRAVENOUS
  Filled 2018-06-15 (×6): qty 100

## 2018-06-15 MED ORDER — LACTATED RINGERS IV SOLN
INTRAVENOUS | Status: DC
  Administered 2018-06-15 (×3): via INTRAVENOUS

## 2018-06-15 MED ORDER — TERBUTALINE SULFATE 1 MG/ML IJ SOLN
0.2500 mg | Freq: Once | INTRAMUSCULAR | Status: DC | PRN
  Filled 2018-06-15: qty 1

## 2018-06-15 MED ORDER — LACTATED RINGERS IV SOLN: 500.0000 mL | Freq: Once | INTRAVENOUS | Status: DC

## 2018-06-15 MED ORDER — LACTATED RINGERS IV SOLN
500.0000 mL | Freq: Once | INTRAVENOUS | Status: AC
  Administered 2018-06-15: 500 mL via INTRAVENOUS

## 2018-06-15 MED ORDER — FENTANYL CITRATE (PF) 100 MCG/2ML IJ SOLN
INTRAMUSCULAR | Status: AC
  Administered 2018-06-15: 100 ug
  Filled 2018-06-15: qty 2

## 2018-06-15 MED ORDER — ONDANSETRON HCL 4 MG/2ML IJ SOLN
4.0000 mg | Freq: Four times a day (QID) | INTRAMUSCULAR | Status: DC | PRN
  Administered 2018-06-15: 4 mg via INTRAVENOUS
  Filled 2018-06-15: qty 2

## 2018-06-15 MED ORDER — SODIUM CHLORIDE 0.9 % IV SOLN
5.0000 10*6.[IU] | Freq: Once | INTRAVENOUS | Status: AC
  Administered 2018-06-15: 5 10*6.[IU] via INTRAVENOUS
  Filled 2018-06-15: qty 5

## 2018-06-15 NOTE — Progress Notes (Signed)
Called Dr.Cousins to update about FHR changes. Verbal order for amnioinfusion and to keep Pitocin at dose of 12. Continue to monitor.

## 2018-06-15 NOTE — Anesthesia Preprocedure Evaluation (Signed)
Anesthesia Evaluation  Patient identified by MRN, date of birth, ID band Patient awake    Reviewed: Allergy & Precautions, Patient's Chart, lab work & pertinent test results  Airway Mallampati: II       Dental  (+) Teeth Intact, Dental Advisory Given   Pulmonary Current Smoker,    Pulmonary exam normal        Cardiovascular negative cardio ROS   Rhythm:Regular Rate:Normal     Neuro/Psych    GI/Hepatic negative GI ROS, Neg liver ROS,   Endo/Other  negative endocrine ROS  Renal/GU negative Renal ROS     Musculoskeletal   Abdominal Normal abdominal exam  (+)   Peds  Hematology   Anesthesia Other Findings   Reproductive/Obstetrics (+) Pregnancy                             Anesthesia Physical Anesthesia Plan  ASA: II  Anesthesia Plan: Epidural   Post-op Pain Management:    Induction:   PONV Risk Score and Plan:   Airway Management Planned: Natural Airway  Additional Equipment: None  Intra-op Plan:   Post-operative Plan:   Informed Consent: I have reviewed the patients History and Physical, chart, labs and discussed the procedure including the risks, benefits and alternatives for the proposed anesthesia with the patient or authorized representative who has indicated his/her understanding and acceptance.     Plan Discussed with:   Anesthesia Plan Comments: (Lab Results      Component                Value               Date                      WBC                      16.0 (H)            06/15/2018                HGB                      11.0 (L)            06/15/2018                HCT                      32.0 (L)            06/15/2018                MCV                      95.0                06/15/2018                PLT                      247                 06/15/2018           )        Anesthesia Quick Evaluation

## 2018-06-15 NOTE — Progress Notes (Signed)
S. C/o pain  O. BP 128/77   Pulse 74   Temp 97.9 F (36.6 C) (Oral)   Resp 16   Ht 5\' 5"  (1.651 m)   Wt 91.2 kg   LMP 09/12/2017   BMI 33.45 kg/m  VE intracervical balloon still in place  Tracing: cat 1 irreg ctx  IMP: Multiparity GBS cx (+) on IV pCN P) cont with cytotec oral. IV analgesic prn

## 2018-06-15 NOTE — H&P (Signed)
Shannon Montoya is Shannon 32 y.o. female presenting for IOL 2nd to multiparity. (+) GBS. OB History    Gravida  4   Para  2   Term      Preterm  2   AB  1   Living  2     SAB      TAB  1   Ectopic      Multiple      Live Births  2          Past Medical History:  Diagnosis Date  . Hidradenitis   . HPV (human papilloma virus) anogenital infection   . Left axillary hidradenitis 05/2016  . Vaginal Pap smear, abnormal    Past Surgical History:  Procedure Laterality Date  . HYDRADENITIS EXCISION Right 03/27/2016   Procedure: EXCISION  HIDRADENITIS RIGHT AXILLA Shannon Montoya CLOSURE;  Surgeon: Shannon Second, MD;  Location: [facility];  Service: Plastics;  Laterality: Right;  . HYDRADENITIS EXCISION Left 06/05/2016   Procedure: EXCISION HIDRADENITIS left AXILLA with Shannon pollock closure;  Surgeon: Shannon Second, MD;  Location: [facility];  Service: Plastics;  Laterality: Left;   Family History: family history includes Breast cancer in her paternal grandmother; Diabetes in her paternal grandmother; Heart attack in her father; Heart disease in her father and mother; Hypertension in her father. Social History:  reports that she has been smoking cigarettes. She has Shannon 2.75 pack-year smoking history. She has never used smokeless tobacco. She reports that she does not drink alcohol or use drugs.     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  History   Last menstrual period 09/12/2017. Maternal Exam:  Abdomen: Patient reports no abdominal  tenderness. Fetal presentation: vertex  Introitus: Normal vulva. Pelvis: adequate for delivery.   Cervix: Cervix evaluated by digital exam.     Physical Exam  Constitutional: She is oriented to person, place, and time. She appears well-developed and well-nourished.  HENT:  Head: Atraumatic.  Eyes: EOM are normal.  Neck: Neck supple.  Cardiovascular: Regular rhythm.  Respiratory: Breath sounds normal.  GI: Soft.  Musculoskeletal: She exhibits no edema.  Neurological: She is alert and oriented to person, place, and time.  Skin: Skin is warm and dry.  Psychiatric: She has Shannon normal mood and affect.    Prenatal labs: ABO, Rh: AB/Positive/-- (04/10 0000) Antibody: Negative (04/10 0000) Rubella: Nonimmune (04/10 0000) RPR: Nonreactive (04/10 0000)  HBsAg: Negative (04/10 0000)  HIV: Non-reactive (04/10 0000)  GBS: Positive (09/16 0000)   Assessment/Plan: multiparity GBS sx positive  Term gestation P) admit routine labs. IV PCN. Oral cytotec. Intracervical balloon  Analgesic/epidural prn. Rubella vaccine pp Shannon Montoya Shannon Montoya 06/15/2018, 8:53 AM

## 2018-06-15 NOTE — Anesthesia Procedure Notes (Signed)
Epidural Patient location during procedure: OB Start time: 06/15/2018 8:51 PM End time: 06/15/2018 8:57 PM  Staffing Anesthesiologist: Shelton Silvas, MD Performed: anesthesiologist   Preanesthetic Checklist Completed: patient identified, site marked, surgical consent, pre-op evaluation, timeout performed, IV checked, risks and benefits discussed and monitors and equipment checked  Epidural Patient position: sitting Prep: ChloraPrep Patient monitoring: heart rate, continuous pulse ox and blood pressure Approach: midline Location: L3-L4 Injection technique: LOR saline  Needle:  Needle type: Tuohy  Needle gauge: 17 G Needle length: 9 cm Catheter type: closed end flexible Catheter size: 20 Guage Test dose: negative and 1.5% lidocaine  Assessment Events: blood not aspirated, injection not painful, no injection resistance and no paresthesia  Additional Notes LOR @ 5.5  Patient identified. Risks/Benefits/Options discussed with patient including but not limited to bleeding, infection, nerve damage, paralysis, failed block, incomplete pain control, headache, blood pressure changes, nausea, vomiting, reactions to medications, itching and postpartum back pain. Confirmed with bedside nurse the patient's most recent platelet count. Confirmed with patient that they are not currently taking any anticoagulation, have any bleeding history or any family history of bleeding disorders. Patient expressed understanding and wished to proceed. All questions were answered. Sterile technique was used throughout the entire procedure. Please see nursing notes for vital signs. Test dose was given through epidural catheter and negative prior to continuing to dose epidural or start infusion. Warning signs of high block given to the patient including shortness of breath, tingling/numbness in hands, complete motor block, or any concerning symptoms with instructions to call for help. Patient was given instructions  on fall risk and not to get out of bed. All questions and concerns addressed with instructions to call with any issues or inadequate analgesia.    Reason for block:procedure for pain

## 2018-06-16 ENCOUNTER — Encounter (HOSPITAL_COMMUNITY): Payer: Self-pay

## 2018-06-16 LAB — RPR: RPR Ser Ql: NONREACTIVE

## 2018-06-16 MED ORDER — FENTANYL CITRATE (PF) 100 MCG/2ML IJ SOLN
INTRAMUSCULAR | Status: DC | PRN
  Administered 2018-06-15 (×2): 50 ug via EPIDURAL

## 2018-06-16 MED ORDER — COCONUT OIL OIL: 1.0000 "application " | TOPICAL_OIL | Status: DC | PRN

## 2018-06-16 MED ORDER — DIPHENHYDRAMINE HCL 25 MG PO CAPS: 25.0000 mg | ORAL_CAPSULE | Freq: Four times a day (QID) | ORAL | Status: DC | PRN

## 2018-06-16 MED ORDER — IBUPROFEN 600 MG PO TABS
600.0000 mg | ORAL_TABLET | Freq: Four times a day (QID) | ORAL | Status: DC
  Administered 2018-06-16 – 2018-06-17 (×5): 600 mg via ORAL
  Filled 2018-06-16 (×6): qty 1

## 2018-06-16 MED ORDER — BENZOCAINE-MENTHOL 20-0.5 % EX AERO: 1.0000 "application " | INHALATION_SPRAY | CUTANEOUS | Status: DC | PRN

## 2018-06-16 MED ORDER — ONDANSETRON HCL 4 MG/2ML IJ SOLN: 4.0000 mg | INTRAMUSCULAR | Status: DC | PRN

## 2018-06-16 MED ORDER — DIBUCAINE 1 % RE OINT: 1.0000 "application " | TOPICAL_OINTMENT | RECTAL | Status: DC | PRN

## 2018-06-16 MED ORDER — WITCH HAZEL-GLYCERIN EX PADS: 1.0000 "application " | MEDICATED_PAD | CUTANEOUS | Status: DC | PRN

## 2018-06-16 MED ORDER — SENNOSIDES-DOCUSATE SODIUM 8.6-50 MG PO TABS
2.0000 | ORAL_TABLET | ORAL | Status: DC
  Administered 2018-06-16: 2 via ORAL
  Filled 2018-06-16: qty 2

## 2018-06-16 MED ORDER — ACETAMINOPHEN 325 MG PO TABS
650.0000 mg | ORAL_TABLET | ORAL | Status: DC | PRN
  Administered 2018-06-16 – 2018-06-17 (×5): 650 mg via ORAL
  Filled 2018-06-16 (×6): qty 2

## 2018-06-16 MED ORDER — FERROUS SULFATE 325 (65 FE) MG PO TABS
325.0000 mg | ORAL_TABLET | Freq: Two times a day (BID) | ORAL | Status: DC
  Administered 2018-06-16 – 2018-06-17 (×3): 325 mg via ORAL
  Filled 2018-06-16 (×3): qty 1

## 2018-06-16 MED ORDER — LIDOCAINE-EPINEPHRINE (PF) 2 %-1:200000 IJ SOLN
INTRAMUSCULAR | Status: DC | PRN
  Administered 2018-06-15: 5 mL via EPIDURAL

## 2018-06-16 MED ORDER — ONDANSETRON HCL 4 MG PO TABS: 4.0000 mg | ORAL_TABLET | ORAL | Status: DC | PRN

## 2018-06-16 MED ORDER — ZOLPIDEM TARTRATE 5 MG PO TABS: 5.0000 mg | ORAL_TABLET | Freq: Every evening | ORAL | Status: DC | PRN

## 2018-06-16 MED ORDER — SIMETHICONE 80 MG PO CHEW: 80.0000 mg | CHEWABLE_TABLET | ORAL | Status: DC | PRN

## 2018-06-16 MED ORDER — MEASLES, MUMPS & RUBELLA VAC ~~LOC~~ INJ
0.5000 mL | INJECTION | Freq: Once | SUBCUTANEOUS | Status: AC
  Administered 2018-06-17: 0.5 mL via SUBCUTANEOUS
  Filled 2018-06-16 (×2): qty 0.5

## 2018-06-16 MED ORDER — PRENATAL MULTIVITAMIN CH
1.0000 | ORAL_TABLET | Freq: Every day | ORAL | Status: DC
  Administered 2018-06-16 – 2018-06-17 (×2): 1 via ORAL
  Filled 2018-06-16 (×2): qty 1

## 2018-06-16 MED ORDER — BUPIVACAINE HCL (PF) 0.25 % IJ SOLN
INTRAMUSCULAR | Status: DC | PRN
  Administered 2018-06-15: 5 mL via EPIDURAL

## 2018-06-16 NOTE — Progress Notes (Signed)
Call placed to [employer] for TED hose.

## 2018-06-16 NOTE — Progress Notes (Signed)
S: s/p epidural  O: pitocin VE: 4-5/90/-2 AROM clear fluid IUPC/ISE placed  Tracing: baseline 120-125 (+) accel ? Ctx frequency  IMP: Multiparity Active labor Term (+) GBS P) right exaggerated. Cont pitocin. IV PCN

## 2018-06-16 NOTE — Anesthesia Postprocedure Evaluation (Signed)
Anesthesia Post Note  Patient: VESNA KABLE  Procedure(s) Performed: AN AD HOC LABOR EPIDURAL     Patient location during evaluation: Mother Baby Anesthesia Type: Epidural Level of consciousness: awake and alert and oriented Pain management: satisfactory to patient Vital Signs Assessment: post-procedure vital signs reviewed and stable Respiratory status: respiratory function stable Cardiovascular status: stable Postop Assessment: no headache, no backache, epidural receding, patient able to bend at knees, no signs of nausea or vomiting and adequate PO intake Anesthetic complications: no    Last Vitals:  Vitals:   06/16/18 0433 06/16/18 0500  BP: 132/81 125/68  Pulse: 82 69  Resp: 16 16  Temp: 37.2 C 36.6 C  SpO2: 100% 98%    Last Pain:  Vitals:   06/16/18 0535  TempSrc:   PainSc: 0-No pain   Pain Goal: Patients Stated Pain Goal: 2 (06/15/18 2321)               Karleen Dolphin

## 2018-06-16 NOTE — Progress Notes (Addendum)
Patient called out to check what she believed to be significant amount of bleeding when she got up to bathroom at 2135..  This nurse was in another room, so another nurse went in to assess.  Blood-soaked pad (250 ml weighed) and patient sitting on toilet with trickle of blood.  This nurse came in and caught 3 quarter-sized clots while patient sitting on toilet. First nurse cleaned her up and brought her back to back; did fundal rub and which produced steady trickle of blood.  Patient felt like she had to void so nurse took BP (146/82) and took to bathroom, where patient passed palm-size clot (fell into toilet).  Patient voided, cleaned up, got back into bed, and when fundus checked again, no more trickle.  Patient stated she had had pain in her hips before episode, but now felt normal.  When this nurse returned, notified charge nurse who checked patient again while pads were weighed. Patient is 20 hours post delivery, has been up ambulating, voiding throughout day, eating, breastfeeding and feeling appropriate before this episode. Physician to be notified after follow-up assessment.

## 2018-06-16 NOTE — Lactation Note (Signed)
This note was copied from a baby's chart. Lactation Consultation Note  Patient Name: Boy Liz Pinho [id number] Date: 06/16/2018 Reason for consult: Initial assessment;Term;Infant < 6lbs Mom states she breastfed her previous babies now 74 and 12 briefly.  She states newborn is latching easily and feeding well.  Instructed to feed with any feeding cue and call out for assist prn.  Mom has her own Spectra pump here.  Instructed to post pump every 3 hours x 15 minutes for breast stimulation.  If milk is expressed it can be given back to baby per spoon/syringe.  Breastfeeding consultation services and support information given and reviewed.  Maternal Data    Feeding    LATCH Score                   Interventions    Lactation Tools Discussed/Used     Consult Status Consult Status: Follow-up Date: 06/17/18 Follow-up type: In-patient    Huston Foley 06/16/2018, 10:18 AM

## 2018-06-17 LAB — CBC
HEMATOCRIT: 28 % — AB (ref 36.0–46.0)
Hemoglobin: 9.6 g/dL — ABNORMAL LOW (ref 12.0–15.0)
MCH: 32.4 pg (ref 26.0–34.0)
MCHC: 34.3 g/dL (ref 30.0–36.0)
MCV: 94.6 fL (ref 80.0–100.0)
PLATELETS: 206 10*3/uL (ref 150–400)
RBC: 2.96 MIL/uL — ABNORMAL LOW (ref 3.87–5.11)
RDW: 13.9 % (ref 11.5–15.5)
WBC: 14.5 10*3/uL — ABNORMAL HIGH (ref 4.0–10.5)
nRBC: 0 % (ref 0.0–0.2)

## 2018-06-17 MED ORDER — IBUPROFEN 600 MG PO TABS: 600.0000 mg | ORAL_TABLET | Freq: Four times a day (QID) | ORAL | 0 refills | Status: DC

## 2018-06-17 NOTE — Progress Notes (Signed)
On reassessment a 0135t, patient's fundal check was back to midline and UE., No bleeding or trickling. VS 138/ 83. Patient comfortable and reports cramping pain has decreased. Is voiding about every two hours.

## 2018-06-17 NOTE — Progress Notes (Signed)
At 2250 this nurse paged Dr Billy Coast, as he was listed as physician on call for practice.  When no reponse, spoke with birthing suites; indicated Dr Cherly Hensen likes to be notified about her patients and to call Carlean Jews, CNM first. Spoke with Sharyl Nimrod at [phone], who recommended calling Dr Cherly Hensen directly.  Called Dr Cherly Hensen at [phone]) and left contact number [phone]).

## 2018-06-17 NOTE — Lactation Note (Signed)
This note was copied from a baby's chart. Lactation Consultation Note  Patient Name: Shannon Montoya [id number] Date: 06/17/2018 Reason for consult: Follow-up assessment;Infant < 6lbs  P3 term baby, at 6% weight loss.  Baby 5 lbs 1.1oz today. Day of discharge, baby 60 hrs old  Observed Mom with baby on the breast in cradle hold.  Baby dressed and not supported on pillow.  Baby's body facing away from breast, and baby latched shallow with flanged lips.  Mom states it feels a little sore and pinching.  Took baby off breast, and nipple noted to be pinched a little.   Added pillow support and had Mom support breast in a U hold.  Mom needing guidance to not sandwich breast too closely to nipples.   Hand expressed colostrum easily.  Baby able to attain a deeper latch, without seeing any areola, when Mom supporting and sandwiching breast in U hold.    A few swallows identified.  Taught Mom how to use alternate breast compression to increase milk transfer.  Recommended she have another latch assessed before discharge, to call RN or have LC assess.  Mom states she hasn't pumped since last evening, as baby cluster fed.  Encouraged her to continue to pump after breast feeding and offer any EBM back to baby.    Mom interested in Lactation follow-up after discharge, request made to clinic. Mom aware of importance of STS, and feeding baby often on cue. Engorgement prevention and treatment reviewed. Mom aware of OP lactation support available to her.  Consult Status Consult Status: Complete Date: 06/17/18 Follow-up type: Out-patient    Judee Clara 06/17/2018, 12:25 PM

## 2018-06-17 NOTE — Progress Notes (Signed)
PPD 1 SVD  S:  Reports feeling fine - ready to go home             Tolerating po/ No nausea or vomiting             Bleeding is light             Pain controlled with motrin             Up ad lib / ambulatory / voiding QS  Newborn Breast / Circumcision planned - requested today prior to DC  O:  VS: BP 130/79 (BP Location: Right Arm)   Pulse 65   Temp 98.2 F (36.8 C) (Oral)   Resp 18   Ht '5\' 5"'  (1.651 m)   Wt 91.2 kg   LMP 09/12/2017   SpO2 96%   Breastfeeding? Unknown   BMI 33.45 kg/m   LABS:             Recent Labs    06/15/18 0750 06/17/18 0534  WBC 16.0* 14.5*  HGB 11.0* 9.6*  PLT 247 206               Blood type: --/--/AB POS, AB POS Performed at [facility], [address], [place], [place] [address]  [phone])  Rubella: Nonimmune (04/10 0000) - offer MMR                 I&O: Intake/Output      10/13 0701 - 10/14 0700 10/14 0701 - 10/15 0700   Urine (mL/kg/hr)     Blood 400    Total Output 400    Net -400                     Physical Exam:             Alert and oriented X3  Abdomen: soft, non-tender, non-distended              Fundus: firm, non-tender, Ueven  Perineum: no edema - intact  Lochia: light  Extremities: no edema, no calf pain or tenderness  A: PPD # 1   Doing well - stable status  P: Routine post partum orders  DC home  [place], MSN, [facility] 06/17/2018, 9:41 AM

## 2018-06-17 NOTE — Discharge Summary (Addendum)
Obstetric Discharge Summary Reason for Admission: induction of labor - elective Prenatal Procedures: none Intrapartum Procedures: spontaneous vaginal delivery, GBS prophylaxis and epidural Postpartum Procedures: none Complications-Operative and Postpartum: none Hemoglobin  Date Value Ref Range Status  06/17/2018 9.6 (L) 12.0 - 15.0 g/dL Final   HCT  Date Value Ref Range Status  06/17/2018 28.0 (L) 36.0 - 46.0 % Final    Physical Exam:  General: alert, cooperative and no distress Lochia: appropriate Uterine Fundus: firm DVT Evaluation: No evidence of DVT seen on physical exam.  Discharge Diagnoses: Term Pregnancy-delivered and IDA of pregnancy  Discharge Information: Date: 06/17/2018 Activity: pelvic rest Diet: routine Medications: PNV, Ibuprofen and Iron Condition: stable Instructions: refer to practice specific booklet Discharge to: home Follow-up Information    [name], MD. Schedule an appointment as soon as possible for a visit in 6 week(s).   Specialty:  Obstetrics and Gynecology Contact information: [address] Shannon Montoya [place] [address] [phone]           Newborn Data: Live born female  Birth Weight: 5 lb 5.9 oz (2435 g) APGAR: 7, 9  Newborn Delivery   Birth date/time:  06/16/2020 01:31:00 Delivery type:  Vaginal, Spontaneous     Home with mother.  Shannon Montoya 06/17/2018, 10:11 AM

## 2018-06-19 ENCOUNTER — Ambulatory Visit: Payer: Self-pay

## 2018-06-19 NOTE — Lactation Note (Addendum)
This note was copied from a baby's chart. 06/19/2018  Name: Shannon Montoya MRN: [id number] Date of Birth: 06/16/2020 Gestational Age: Gestational Age: [redacted]w[redacted]d Birth Weight: 85.9 oz Weight today:    5 pounds 2 ounces (2324 grams) with clean newborn diaper  Infant presents today with both parents for feeding assessment. Mom reports she is at the point of giving up on BF, she BF her first 2 children "very briefly". Mom reports she does not think her milk is in, she is feeling fuller today compared to previous days.   Infant has gained 25 grams in the last 3 days with an average daily weight gain of 8 grams a day. Mom reports infant has not been weighed since he left the hospital. They have a follow up Ped appt today.    Mom reports she is having a lot of pain with feedings and is considering stopping BF. She reports she is glad for this appointment.   Parents reports infant self awakens for feedings about every 2.5 hours. Mom reports infant feeds on one breast usually. Mom reports nipple pain of a 9. Today she reports a nipple pain of 4.   Infant active and alert and latched well for feeding. Infant fed well with active swallowing. Infant needs upper lip flanged with feeding, upper lip blanches with feeding. Mom with slight compression post feeding and pain with feeding, she reports the pain was better today.   Mom was fitted for a # 24 NS, she reports the feeding felt better. Discussed importance of weaning off as soon as possible. Reviewed pumping post feeding 4 x a day to protect supply.   Reviewed nipple care with mom. Enc her to apply EBM to nipple post feeding and then gave her Comfort gels to use. Nipple tissue intact. Mom does report a lot of pain with feeding and pumping. Reviewed pump settings and flange size.   Infant to follow up with Lactation in 1 week. Infant to follow up with Pediatrician Dr. Sheliah Hatch today. [facility] has not contacted family yet, enc mom to use services  for weight check for infant.   Parents report questions have been answered. Mom to call with questions/concerns as needed.   Dr. Vedia Pereyra office was called to let them know parents were leaving Lactation appt to come to ped appt.       General Information: Mother's reason for visit: SN, pain with feeding, "A lot harder that I expected" Consult: Initial Lactation consultant: Noralee Stain RN,IBCLC Breastfeeding experience: BF every 2 hours, self awakening   Maternal medications: Pre-natal vitamin, Motrin (ibuprofen)  Breastfeeding History: Frequency of breast feeding: every 2.5 hours, 20 minutes, one breast, self awakening Duration of feeding: 20 minutes  Supplementation:                 Pump type: Spectra(S2) Pump frequency: tried once, stopped due to pain    Infant Output Assessment: Voids per 24 hours: 2+ Urine color: Clear yellow Stools per 24 hours: 4+ Stool color: Green  Breast Assessment: Breast: Soft, Compressible Nipple: Erect Pain level: 4(has been up to a 9) Pain interventions: Comfort gels, Bra, Lanolin, Nipple shield  Feeding Assessment: Infant oral assessment: Variance Infant oral assessment comment: infant with thick labial frenulum wtih some lip blanching at the breast and with flanging Positioning: Football(left breast, 10 mintues) Latch: 2 - Grasps breast easily, tongue down, lips flanged, rhythmical sucking. Audible swallowing: 2 - Spontaneous and intermittent Type of nipple: 2 - Everted at rest and  after stimulation Comfort: 1 - Filling, red/small blisters or bruises, mild/mod discomfort Hold: 1 - Assistance needed to correctly position infant at breast and maintain latch LATCH score: 8 Latch assessment: Shallow Lips flanged: No(upper lip needed flanging) Suck assessment: Displays both   Pre-feed weight: 2324 grams Post feed weight: 2354 grams Amount transferred: 30 ml Amount supplemented: 0  Additional Feeding Assessment: Infant  oral assessment: Variance Infant oral assessment comment: see above Positioning: Football(right breast, 10 mintues) Latch: 2 - Grasps breast easily, tongue down, lips flanged, rhythmical sucking. Audible swallowing: 2 - Spontaneous and intermittent Type of nipple: 2 - Everted at rest and after stimulation Comfort: 1 - Filling, red/small blisters or bruises, mild/mod discomfort Hold: 1 - Assistance needed to correctly position infant at breast and maintain latch LATCH score: 8 Latch assessment: Deep Lips flanged: No(upper lip needed flanging) Suck assessment: Displays both   Pre-feed weight: 2354 grams Post feed weight: 2370 grams Amount transferred: 26 ml Amount supplemented: 0  Totals: Total amount transferred: 46 ml Total supplement given: 0 Total amount pumped post feed: did not pump   Plan:  1. Offer infant breast with feeding cues, offer both breasts with each feeding. Infant needs at least 8 feedings in 24 hours until he is back to birthweight 2. Keep infant awake with feedings as needed 3. Massage/compress breast with feeding as needed to maintain active feeding 4. Use the # 24 Nipple Shield with feedings as needed for pain, try each feeding without the nipple shield before using.  5. If needed, pump and rest the nipples and pump to offer infant supplement 6. When pumping, use your double electric breast pump for 15-20 minutes, massage breasts while pumping. Make sure to pump any time infant receiving a bottle. If using the Nipple shield for longer than 24 hours, it is recommended that you pump about 4 x a day to protect milk supply.  7. Review storage guidelines on page 73 of your Understanding Mother and Baby Care 8. When offering a bottle use a slow flow nipple nipple such as a Dr. Theora Gianotti level 1 nipple, use a Dr. Theora Gianotti Preemie nipple if infant choking or drooling on the bottle 9. Feed infant using the Paced bottle feeding method for feed the bottle (video on  [email or web address].com) 10. Infant needs about 43-58 ml (1.5-2 ounces) for 8 feedings a day or 345-460 ml (12-15 ounces) by the end of the first week. Infant may take more or less depending on how often he feeds 10. Rest then infant sleeping 11. Keep up the good work 12. Thank you for allowing me to assist you today 13. Please call with any questions/concerns as needed 14. Follow up with Lactation in 1 week  Ed Blalock RN, IBCLC                                                      Silas Flood Sulaiman Imbert 06/19/2018, 10:24 AM

## 2018-06-22 ENCOUNTER — Inpatient Hospital Stay (HOSPITAL_COMMUNITY)
Admission: AD | Admit: 2018-06-22 | Payer: BLUE CROSS/BLUE SHIELD | Source: Ambulatory Visit | Admitting: Obstetrics and Gynecology

## 2018-09-04 NOTE — L&D Delivery Note (Signed)
Delivery Note At 1:12 AM a viable and healthy female was delivered via Vaginal, Spontaneous (Presentation:vtx ; OA ).  APGAR: 9, 10; weight  pending.   Placenta status:spontaneous intact not sent , .  Cord: CAN x 1 not reducible  with the following complications: .  Cord pH: n/a  Anesthesia:epidural   Episiotomy: None Lacerations: None Suture Repair: n/a Est. Blood Loss (mL): 48  Mom to postpartum.  Baby to Couplet care / Skin to Skin.  Shannon Montoya A Shannon Montoya 06/25/2019, 1:51 AM

## 2018-11-25 ENCOUNTER — Encounter: Payer: Self-pay | Admitting: Physician Assistant

## 2018-11-25 ENCOUNTER — Telehealth: Payer: BLUE CROSS/BLUE SHIELD | Admitting: Physician Assistant

## 2018-11-25 DIAGNOSIS — R059 Cough, unspecified: Secondary | ICD-10-CM

## 2018-11-25 DIAGNOSIS — R1110 Vomiting, unspecified: Secondary | ICD-10-CM

## 2018-11-25 DIAGNOSIS — J029 Acute pharyngitis, unspecified: Secondary | ICD-10-CM

## 2018-11-25 DIAGNOSIS — R05 Cough: Secondary | ICD-10-CM

## 2018-11-25 NOTE — Progress Notes (Signed)
E-Visit for Corona Virus Screening  Based on your current symptoms, it seems unlikely that your symptoms are related to the Coronavirus.   Coronavirus disease 2019 (COVID-19) is a respiratory illness that can spread from person to person. The virus that causes COVID-19 is a new virus that was first identified in the country of [place] but is now found in multiple other countries and has spread to the [place].  Symptoms associated with the virus are mild to severe fever, cough, and shortness of breath. There is currently no vaccine to protect against COVID-19, and there is no specific antiviral treatment for the virus.   Follow up with OBGYN for any medications you may take for your symptoms. As per our phone conversation, vomiting and sore throat could be sign of strep sore throat, and due to pregnancy, we would rather confirm the diagnosis than just treat. So, follow up with your doctor for further workup.     To be considered HIGH RISK for Coronavirus (COVID-19), you have to meet the following criteria:  . Traveled to [place], [place], [place], [place] or [place]; or in the [place] to [place], [place], [place], or [place]; and have fever, cough, and shortness of breath within the last 2 weeks of travel OR  . Been in close contact with a person diagnosed with COVID-19 within the last 2 weeks and have fever, cough, and shortness of breath  . IF YOU DO NOT MEET THESE CRITERIA, YOU ARE CONSIDERED LOW RISK FOR COVID-19.   It is vitally important that if you feel that you have an infection such as this virus or any other virus that you stay home and away from places where you may spread it to others.  You should self-quarantine for 14 days if you have symptoms that could potentially be coronavirus and avoid contact with people age 74 and older.     You may also take acetaminophen (Tylenol) as needed for fever.   Reduce your risk of any infection by using the same precautions used  for avoiding the common cold or flu:  Marland Kitchen Wash your hands often with soap and warm water for at least 20 seconds.  If soap and water are not readily available, use an alcohol-based hand sanitizer with at least 60% alcohol.  . If coughing or sneezing, cover your mouth and nose by coughing or sneezing into the elbow areas of your shirt or coat, into a tissue or into your sleeve (not your hands). . Avoid shaking hands with others and consider head nods or verbal greetings only. . Avoid touching your eyes, nose, or mouth with unwashed hands.  . Avoid close contact with people who are sick. . Avoid places or events with large numbers of people in one location, like concerts or sporting events. . Carefully consider travel plans you have or are making. . If you are planning any travel outside or inside the [place], visit the CDC's Travelers' Health webpage for the latest health notices. . If you have some symptoms but not all symptoms, continue to monitor at home and seek medical attention if your symptoms worsen. . If you are having a medical emergency, call 911.  HOME CARE . Only take medications as instructed by your medical team. . Drink plenty of fluids and get plenty of rest. . A steam or ultrasonic humidifier can help if you have congestion.   GET HELP RIGHT AWAY IF: . You develop worsening fever. . You become short of  breath . You cough up blood. . Your symptoms become more severe MAKE SURE YOU   Understand these instructions.  Will watch your condition.  Will get help right away if you are not doing well or get worse.  Your e-visit answers were reviewed by a board certified advanced clinical practitioner to complete your personal care plan.  Depending on the condition, your plan could have included both over the counter or prescription medications.  If there is a problem please reply once you have received a response from your provider. Your safety is important to [place].  If you have drug  allergies check your prescription carefully.    You can use MyChart to ask questions about today's visit, request a non-urgent call back, or ask for a work or school excuse for 24 hours related to this e-Visit. If it has been greater than 24 hours you will need to follow up with your provider, or enter a new e-Visit to address those concerns. You will get an e-mail in the next two days asking about your experience.  I hope that your e-visit has been valuable and will speed your recovery. Thank you for using e-visits.   I have spent 7 min in completion and review of this note- Illa Level [facility]

## 2019-06-20 ENCOUNTER — Other Ambulatory Visit: Payer: Self-pay | Admitting: Obstetrics and Gynecology

## 2019-06-23 ENCOUNTER — Inpatient Hospital Stay (HOSPITAL_COMMUNITY): Admission: RE | Admit: 2019-06-23 | Payer: BC Managed Care – PPO | Source: Ambulatory Visit

## 2019-06-23 ENCOUNTER — Other Ambulatory Visit: Payer: Self-pay

## 2019-06-23 DIAGNOSIS — Z20822 Contact with and (suspected) exposure to covid-19: Secondary | ICD-10-CM

## 2019-06-24 ENCOUNTER — Inpatient Hospital Stay (HOSPITAL_COMMUNITY)
Admission: AD | Admit: 2019-06-24 | Discharge: 2019-06-24 | Disposition: A | Discharge status: Home / Self Care | Payer: BC Managed Care – PPO | Source: Home / Self Care | Attending: Obstetrics and Gynecology | Admitting: Obstetrics and Gynecology

## 2019-06-24 ENCOUNTER — Encounter (HOSPITAL_COMMUNITY): Payer: Self-pay

## 2019-06-24 ENCOUNTER — Encounter (HOSPITAL_COMMUNITY): Payer: Self-pay | Admitting: *Deleted

## 2019-06-24 ENCOUNTER — Inpatient Hospital Stay (HOSPITAL_COMMUNITY)
Admission: AD | Admit: 2019-06-24 | Discharge: 2019-06-27 | DRG: 806 | Disposition: A | Discharge status: Home / Self Care | Payer: BC Managed Care – PPO | Attending: Obstetrics and Gynecology | Admitting: Obstetrics and Gynecology

## 2019-06-24 ENCOUNTER — Other Ambulatory Visit: Payer: Self-pay

## 2019-06-24 DIAGNOSIS — Z3A39 39 weeks gestation of pregnancy: Secondary | ICD-10-CM

## 2019-06-24 DIAGNOSIS — F17210 Nicotine dependence, cigarettes, uncomplicated: Secondary | ICD-10-CM | POA: Diagnosis present

## 2019-06-24 DIAGNOSIS — O99824 Streptococcus B carrier state complicating childbirth: Secondary | ICD-10-CM | POA: Diagnosis present

## 2019-06-24 DIAGNOSIS — Z3490 Encounter for supervision of normal pregnancy, unspecified, unspecified trimester: Secondary | ICD-10-CM | POA: Diagnosis present

## 2019-06-24 DIAGNOSIS — O99334 Smoking (tobacco) complicating childbirth: Secondary | ICD-10-CM | POA: Diagnosis present

## 2019-06-24 DIAGNOSIS — D62 Acute posthemorrhagic anemia: Secondary | ICD-10-CM | POA: Diagnosis not present

## 2019-06-24 DIAGNOSIS — E669 Obesity, unspecified: Secondary | ICD-10-CM | POA: Diagnosis present

## 2019-06-24 DIAGNOSIS — O99214 Obesity complicating childbirth: Secondary | ICD-10-CM | POA: Diagnosis present

## 2019-06-24 DIAGNOSIS — Z20828 Contact with and (suspected) exposure to other viral communicable diseases: Secondary | ICD-10-CM | POA: Diagnosis present

## 2019-06-24 DIAGNOSIS — O471 False labor at or after 37 completed weeks of gestation: Secondary | ICD-10-CM

## 2019-06-24 DIAGNOSIS — O134 Gestational [pregnancy-induced] hypertension without significant proteinuria, complicating childbirth: Principal | ICD-10-CM | POA: Diagnosis present

## 2019-06-24 DIAGNOSIS — O9081 Anemia of the puerperium: Secondary | ICD-10-CM | POA: Diagnosis not present

## 2019-06-24 LAB — CBC
HCT: 35.6 % — ABNORMAL LOW (ref 36.0–46.0)
Hemoglobin: 12.1 g/dL (ref 12.0–15.0)
MCH: 31.8 pg (ref 26.0–34.0)
MCHC: 34 g/dL (ref 30.0–36.0)
MCV: 93.4 fL (ref 80.0–100.0)
Platelets: 274 10*3/uL (ref 150–400)
RBC: 3.81 MIL/uL — ABNORMAL LOW (ref 3.87–5.11)
RDW: 13.8 % (ref 11.5–15.5)
WBC: 21 10*3/uL — ABNORMAL HIGH (ref 4.0–10.5)
nRBC: 0 % (ref 0.0–0.2)

## 2019-06-24 MED ORDER — DIPHENHYDRAMINE HCL 50 MG/ML IJ SOLN: 12.5000 mg | INTRAMUSCULAR | Status: DC | PRN

## 2019-06-24 MED ORDER — PHENYLEPHRINE 40 MCG/ML (10ML) SYRINGE FOR IV PUSH (FOR BLOOD PRESSURE SUPPORT): 80.0000 ug | PREFILLED_SYRINGE | INTRAVENOUS | Status: DC | PRN

## 2019-06-24 MED ORDER — OXYTOCIN 40 UNITS IN NORMAL SALINE INFUSION - SIMPLE MED
2.5000 [IU]/h | INTRAVENOUS | Status: DC
  Administered 2019-06-25: 2.5 [IU]/h via INTRAVENOUS
  Filled 2019-06-24: qty 1000

## 2019-06-24 MED ORDER — LACTATED RINGERS IV SOLN: 500.0000 mL | Freq: Once | INTRAVENOUS | Status: DC

## 2019-06-24 MED ORDER — FENTANYL-BUPIVACAINE-NACL 0.5-0.125-0.9 MG/250ML-% EP SOLN
EPIDURAL | Status: AC
  Filled 2019-06-24: qty 250

## 2019-06-24 MED ORDER — SODIUM CHLORIDE 0.9 % IV SOLN
5.0000 10*6.[IU] | Freq: Once | INTRAVENOUS | Status: AC
  Administered 2019-06-25: 5 10*6.[IU] via INTRAVENOUS
  Filled 2019-06-24: qty 5

## 2019-06-24 MED ORDER — PENICILLIN G 3 MILLION UNITS IVPB - SIMPLE MED: 3.0000 10*6.[IU] | INTRAVENOUS | Status: DC

## 2019-06-24 MED ORDER — EPHEDRINE 5 MG/ML INJ: 10.0000 mg | INTRAVENOUS | Status: DC | PRN

## 2019-06-24 MED ORDER — TERBUTALINE SULFATE 1 MG/ML IJ SOLN: 0.2500 mg | Freq: Once | INTRAMUSCULAR | Status: DC | PRN

## 2019-06-24 MED ORDER — OXYTOCIN 40 UNITS IN NORMAL SALINE INFUSION - SIMPLE MED: 1.0000 m[IU]/min | INTRAVENOUS | Status: DC

## 2019-06-24 MED ORDER — LACTATED RINGERS IV SOLN
INTRAVENOUS | Status: DC
  Administered 2019-06-24: via INTRAVENOUS

## 2019-06-24 MED ORDER — SOD CITRATE-CITRIC ACID 500-334 MG/5ML PO SOLN: 30.0000 mL | ORAL | Status: DC | PRN

## 2019-06-24 MED ORDER — ONDANSETRON HCL 4 MG/2ML IJ SOLN: 4.0000 mg | Freq: Four times a day (QID) | INTRAMUSCULAR | Status: DC | PRN

## 2019-06-24 MED ORDER — OXYTOCIN BOLUS FROM INFUSION
500.0000 mL | Freq: Once | INTRAVENOUS | Status: AC
  Administered 2019-06-25: 500 mL via INTRAVENOUS

## 2019-06-24 MED ORDER — FENTANYL-BUPIVACAINE-NACL 0.5-0.125-0.9 MG/250ML-% EP SOLN: 12.0000 mL/h | EPIDURAL | Status: DC | PRN

## 2019-06-24 MED ORDER — ACETAMINOPHEN 325 MG PO TABS: 650.0000 mg | ORAL_TABLET | ORAL | Status: DC | PRN

## 2019-06-24 MED ORDER — LIDOCAINE HCL (PF) 1 % IJ SOLN: 30.0000 mL | INTRAMUSCULAR | Status: DC | PRN

## 2019-06-24 MED ORDER — LACTATED RINGERS IV SOLN: 500.0000 mL | INTRAVENOUS | Status: DC | PRN

## 2019-06-24 NOTE — Anesthesia Preprocedure Evaluation (Signed)
Anesthesia Evaluation  Patient identified by MRN, date of birth, ID band Patient awake    Reviewed: Allergy & Precautions, Patient's Chart, lab work & pertinent test results  Airway Mallampati: II  TM Distance: >3 FB Neck ROM: Full    Dental no notable dental hx. (+) Teeth Intact   Pulmonary Current SmokerPatient did not abstain from smoking.,    Pulmonary exam normal breath sounds clear to auscultation       Cardiovascular negative cardio ROS Normal cardiovascular exam Rhythm:Regular Rate:Normal     Neuro/Psych negative neurological ROS  negative psych ROS   GI/Hepatic negative GI ROS, Neg liver ROS,   Endo/Other  Obesity  Renal/GU negative Renal ROS  negative genitourinary   Musculoskeletal negative musculoskeletal ROS (+)   Abdominal (+) + obese,   Peds  Hematology  (+) anemia ,   Anesthesia Other Findings   Reproductive/Obstetrics (+) Pregnancy                             Anesthesia Physical Anesthesia Plan  ASA: II  Anesthesia Plan: Epidural   Post-op Pain Management:    Induction:   PONV Risk Score and Plan:   Airway Management Planned: Natural Airway  Additional Equipment:   Intra-op Plan:   Post-operative Plan:   Informed Consent: I have reviewed the patients History and Physical, chart, labs and discussed the procedure including the risks, benefits and alternatives for the proposed anesthesia with the patient or authorized representative who has indicated his/her understanding and acceptance.       Plan Discussed with: Anesthesiologist  Anesthesia Plan Comments:         Anesthesia Quick Evaluation

## 2019-06-24 NOTE — Discharge Instructions (Signed)

## 2019-06-24 NOTE — MAU Note (Signed)
Contractions started early this morning, now every 77min.  No bleeding or leaking.

## 2019-06-24 NOTE — MAU Note (Signed)
2-3 cm earlier today.  Contractions stronger and closer at 7 pm.  No bleeding. No leaking. Baby moving well. Vomited twice with contractions.

## 2019-06-24 NOTE — MAU Note (Signed)
I have communicated with Dr. Garwin Brothers and reviewed vital signs:  Vitals:   06/24/19 0959 06/24/19 1111  BP: 127/74 130/79  Pulse: 83   Resp: 18   Temp: 98.1 F (36.7 C)   SpO2: 99%     Vaginal exam:  Dilation: 3 Effacement (%): 80 Cervical Position: Posterior Station: -3 Presentation: Vertex Exam by:: B. Carneshia Raker, RN,   Also reviewed contraction pattern and that non-stress test is reactive.  It has been documented that patient is contracting every 5-6 minutes with minimal cervical change over 2 hours not indicating active labor.  Patient denies any other complaints.  Based on this report provider has given order for discharge.  A discharge order and diagnosis entered by a provider.   Labor discharge instructions reviewed with patient.

## 2019-06-25 ENCOUNTER — Encounter (HOSPITAL_COMMUNITY): Payer: Self-pay

## 2019-06-25 ENCOUNTER — Inpatient Hospital Stay (HOSPITAL_COMMUNITY): Payer: BC Managed Care – PPO | Admitting: Anesthesiology

## 2019-06-25 ENCOUNTER — Inpatient Hospital Stay (HOSPITAL_COMMUNITY): Payer: BC Managed Care – PPO

## 2019-06-25 ENCOUNTER — Inpatient Hospital Stay (HOSPITAL_COMMUNITY)
Admission: AD | Admit: 2019-06-25 | Payer: BC Managed Care – PPO | Source: Home / Self Care | Admitting: Obstetrics and Gynecology

## 2019-06-25 LAB — TYPE AND SCREEN
ABO/RH(D): AB POS
Antibody Screen: NEGATIVE

## 2019-06-25 LAB — COMPREHENSIVE METABOLIC PANEL
ALT: 16 U/L (ref 0–44)
AST: 26 U/L (ref 15–41)
Albumin: 3.3 g/dL — ABNORMAL LOW (ref 3.5–5.0)
Alkaline Phosphatase: 171 U/L — ABNORMAL HIGH (ref 38–126)
Anion gap: 14 (ref 5–15)
BUN: 8 mg/dL (ref 6–20)
CO2: 15 mmol/L — ABNORMAL LOW (ref 22–32)
Calcium: 8.7 mg/dL — ABNORMAL LOW (ref 8.9–10.3)
Chloride: 107 mmol/L (ref 98–111)
Creatinine, Ser: 0.69 mg/dL (ref 0.44–1.00)
GFR calc Af Amer: >60 mL/min (ref >60)
GFR calc non Af Amer: >60 mL/min (ref >60)
Glucose, Bld: 101 mg/dL — ABNORMAL HIGH (ref 70–99)
Potassium: 4.1 mmol/L (ref 3.5–5.1)
Sodium: 136 mmol/L (ref 135–145)
Total Bilirubin: 0.6 mg/dL (ref 0.3–1.2)
Total Protein: 7 g/dL (ref 6.5–8.1)

## 2019-06-25 LAB — SARS CORONAVIRUS 2 BY RT PCR (HOSPITAL ORDER, PERFORMED IN ~~LOC~~ HOSPITAL LAB): SARS Coronavirus 2: NEGATIVE

## 2019-06-25 LAB — URIC ACID: Uric Acid, Serum: 5.9 mg/dL (ref 2.5–7.1)

## 2019-06-25 LAB — RPR: RPR Ser Ql: NONREACTIVE

## 2019-06-25 LAB — NOVEL CORONAVIRUS, NAA: SARS-CoV-2, NAA: NOT DETECTED

## 2019-06-25 MED ORDER — LIDOCAINE HCL (PF) 1 % IJ SOLN
INTRAMUSCULAR | Status: DC | PRN
  Administered 2019-06-25 (×2): 4 mL via EPIDURAL

## 2019-06-25 MED ORDER — LABETALOL HCL 5 MG/ML IV SOLN
INTRAVENOUS | Status: AC
  Filled 2019-06-25: qty 4

## 2019-06-25 MED ORDER — SENNOSIDES-DOCUSATE SODIUM 8.6-50 MG PO TABS
2.0000 | ORAL_TABLET | ORAL | Status: DC
  Administered 2019-06-25 – 2019-06-26 (×2): 2 via ORAL
  Filled 2019-06-25 (×2): qty 2

## 2019-06-25 MED ORDER — COCONUT OIL OIL: 1.0000 "application " | TOPICAL_OIL | Status: DC | PRN

## 2019-06-25 MED ORDER — SIMETHICONE 80 MG PO CHEW: 80.0000 mg | CHEWABLE_TABLET | ORAL | Status: DC | PRN

## 2019-06-25 MED ORDER — ONDANSETRON HCL 4 MG PO TABS: 4.0000 mg | ORAL_TABLET | ORAL | Status: DC | PRN

## 2019-06-25 MED ORDER — DIBUCAINE (PERIANAL) 1 % EX OINT: 1.0000 "application " | TOPICAL_OINTMENT | CUTANEOUS | Status: DC | PRN

## 2019-06-25 MED ORDER — WITCH HAZEL-GLYCERIN EX PADS: 1.0000 "application " | MEDICATED_PAD | CUTANEOUS | Status: DC | PRN

## 2019-06-25 MED ORDER — LABETALOL HCL 5 MG/ML IV SOLN: 80.0000 mg | INTRAVENOUS | Status: DC | PRN

## 2019-06-25 MED ORDER — DIPHENHYDRAMINE HCL 25 MG PO CAPS: 25.0000 mg | ORAL_CAPSULE | Freq: Four times a day (QID) | ORAL | Status: DC | PRN

## 2019-06-25 MED ORDER — SODIUM CHLORIDE (PF) 0.9 % IJ SOLN
INTRAMUSCULAR | Status: DC | PRN
  Administered 2019-06-25: 12 mL/h via EPIDURAL

## 2019-06-25 MED ORDER — LABETALOL HCL 5 MG/ML IV SOLN: 40.0000 mg | INTRAVENOUS | Status: DC | PRN

## 2019-06-25 MED ORDER — ACETAMINOPHEN 325 MG PO TABS
650.0000 mg | ORAL_TABLET | ORAL | Status: DC | PRN
  Administered 2019-06-25 (×3): 650 mg via ORAL
  Filled 2019-06-25 (×3): qty 2

## 2019-06-25 MED ORDER — ONDANSETRON HCL 4 MG/2ML IJ SOLN: 4.0000 mg | INTRAMUSCULAR | Status: DC | PRN

## 2019-06-25 MED ORDER — PRENATAL MULTIVITAMIN CH
1.0000 | ORAL_TABLET | Freq: Every day | ORAL | Status: DC
  Administered 2019-06-25 – 2019-06-27 (×3): 1 via ORAL
  Filled 2019-06-25 (×3): qty 1

## 2019-06-25 MED ORDER — HYDRALAZINE HCL 20 MG/ML IJ SOLN: 10.0000 mg | INTRAMUSCULAR | Status: DC | PRN

## 2019-06-25 MED ORDER — ZOLPIDEM TARTRATE 5 MG PO TABS: 5.0000 mg | ORAL_TABLET | Freq: Every evening | ORAL | Status: DC | PRN

## 2019-06-25 MED ORDER — FERROUS SULFATE 325 (65 FE) MG PO TABS
325.0000 mg | ORAL_TABLET | Freq: Two times a day (BID) | ORAL | Status: DC
  Administered 2019-06-25 – 2019-06-27 (×5): 325 mg via ORAL
  Filled 2019-06-25 (×5): qty 1

## 2019-06-25 MED ORDER — LABETALOL HCL 5 MG/ML IV SOLN
20.0000 mg | Freq: Once | INTRAVENOUS | Status: AC
  Administered 2019-06-25: 20 mg via INTRAVENOUS

## 2019-06-25 MED ORDER — IBUPROFEN 600 MG PO TABS
600.0000 mg | ORAL_TABLET | Freq: Four times a day (QID) | ORAL | Status: DC
  Administered 2019-06-25 – 2019-06-27 (×10): 600 mg via ORAL
  Filled 2019-06-25 (×10): qty 1

## 2019-06-25 MED ORDER — LABETALOL HCL 5 MG/ML IV SOLN: 20.0000 mg | INTRAVENOUS | Status: DC | PRN

## 2019-06-25 MED ORDER — BENZOCAINE-MENTHOL 20-0.5 % EX AERO
1.0000 "application " | INHALATION_SPRAY | CUTANEOUS | Status: DC | PRN
  Administered 2019-06-25: 1 via TOPICAL
  Filled 2019-06-25: qty 56

## 2019-06-25 NOTE — Anesthesia Postprocedure Evaluation (Signed)
Anesthesia Post Note  Patient: Shannon Montoya  Procedure(s) Performed: AN AD HOC LABOR EPIDURAL     Patient location during evaluation: Mother Baby Anesthesia Type: Epidural Level of consciousness: awake, awake and alert and oriented Pain management: pain level controlled Vital Signs Assessment: post-procedure vital signs reviewed and stable Respiratory status: spontaneous breathing and respiratory function stable Cardiovascular status: blood pressure returned to baseline Postop Assessment: no headache, epidural receding, patient able to bend at knees, adequate PO intake, no backache, no apparent nausea or vomiting and able to ambulate Anesthetic complications: no    Last Vitals:  Vitals:   06/25/19 0320 06/25/19 0418  BP: 134/80 (!) 148/89  Pulse: 87 90  Resp: 18 18  Temp: 37 C 37.5 C  SpO2: 100% 100%    Last Pain:  Vitals:   06/25/19 0607  TempSrc:   PainSc: 4    Pain Goal:                   Bufford Spikes

## 2019-06-25 NOTE — Lactation Note (Signed)
This note was copied from a baby's chart. Lactation Consultation Note  Patient Name: Shannon Montoya IHWTU'U Date: 06/25/2019 Reason for consult: Initial assessment   P4 mom bf 50 and 35 year old for 2 months and her 34 year old was fed for 2 months.  Baby held by mom, cueing, sucking hands.  Infant swaddled in blankets.    LC offered to assist with feed.  Hand exp. Reviewed.  Mom returned demo.  Drops easily expressed.  Mom attempted to latch in cradle hold.  Shallow latch achieved.  LC assisted with helping support infant with more pillows and encouraging a hold where head and neck were supported.  Mom unlatched infant and nipple was slightly compressed.  Relatched in football hold.  Infant took a few sucks when stimulated.  LC encouraged mom to massage and compress breast tissue in order to stimulate infant and start a rhythmic sucking pattern.   Colostrum container provided and mom was encouraged to hand express and collect if she desired.  BF basics reviewed, feeding cues, STS, at least   8-12 times in 24 hours.  Lactation brochure left with family.  SG and OP lactation services shared as well as lactation phone.     Maternal Data Has patient been taught Hand Expression?: Yes Does the patient have breastfeeding experience prior to this delivery?: Yes  Feeding Feeding Type: Breast Fed  LATCH Score Latch: Repeated attempts needed to sustain latch, nipple held in mouth throughout feeding, stimulation needed to elicit sucking reflex.  Audible Swallowing: None  Type of Nipple: Everted at rest and after stimulation  Comfort (Breast/Nipple): Soft / non-tender  Hold (Positioning): Assistance needed to correctly position infant at breast and maintain latch.  LATCH Score: 6  Interventions Interventions: Breast feeding basics reviewed;Assisted with latch;Skin to skin;Breast massage;Hand express;Position options;Support pillows;Adjust position;Breast compression  Lactation Tools  Discussed/Used     Consult Status Consult Status: Follow-up Date: 06/26/19 Follow-up type: In-patient    Ferne Coe [facility] 06/25/2019, 4:41 PM

## 2019-06-25 NOTE — H&P (Signed)
Shannon Montoya is a 33 y.o. female presenting @ term in active labor. Intact membrane. GBS cx positive OB History    Gravida  5   Para  3   Term  1   Preterm  2   AB  1   Living  3     SAB      TAB  1   Ectopic      Multiple  0   Live Births  3          Past Medical History:  Diagnosis Date  . Hidradenitis   . HPV (human papilloma virus) anogenital infection   . Left axillary hidradenitis 05/2016  . Vaginal Pap smear, abnormal    Past Surgical History:  Procedure Laterality Date  . HYDRADENITIS EXCISION Right 03/27/2016   Procedure: EXCISION  HIDRADENITIS RIGHT AXILLA RYAN [place];  Surgeon: Cristine Polio, MD;  Location: [place];  Service: Plastics;  Laterality: Right;  . HYDRADENITIS EXCISION Left 06/05/2016   Procedure: EXCISION HIDRADENITIS left AXILLA with ryan pollock closure;  Surgeon: Cristine Polio, MD;  Location: [place];  Service: Plastics;  Laterality: Left;   Family History: family history includes Breast cancer in her paternal grandmother; Diabetes in her paternal grandmother; Heart attack in her father; Heart disease in her father and mother; Hypertension in her father. Social History:  reports that she has been smoking cigarettes. She has a 2.75 pack-year smoking history. She has never used smokeless tobacco. She reports that she does not drink alcohol or use drugs.     Maternal Diabetes: No Genetic Screening: Normal Maternal Ultrasounds/Referrals: Normal Fetal Ultrasounds or other Referrals:  None Maternal Substance Abuse:  No Significant Maternal Medications:  Meds include: Other: 17 OHP until 36 wk Significant Maternal Lab Results:  Group B Strep positive Other Comments:  hx PTB x 2  Review of Systems  All other systems reviewed and are negative.  History Dilation: 8 Effacement (%): 80 Station: -1 Exam by:: Dr. Garwin Brothers  Blood pressure (!) 169/83, pulse (!) 107, temperature 98.2 F (36.8  C), temperature source Oral, resp. rate 18, SpO2 99 %, unknown if currently breastfeeding. Exam Physical Exam  Constitutional: She is oriented to person, place, and time. She appears well-developed and well-nourished.  Eyes: EOM are normal.  Neck: Neck supple.  Cardiovascular: Regular rhythm.  Respiratory: Breath sounds normal.  GI: Soft.  Musculoskeletal:        General: Edema present.  Neurological: She is alert and oriented to person, place, and time.  Skin: Skin is warm and dry.  Psychiatric: She has a normal mood and affect.    Prenatal labs: ABO, Rh: --/--/AB POS (10/20 2315) Antibody: NEG (10/20 2315) Rubella:  Immune RPR:   nR HBsAg:   neg HIV:   neg GBS:   positive  Assessment/Plan: Active labor GBS cx positive Term Gest HTN P) admit routine labs [place] labs. IV PCN . Amniotomy prn. epidural Antihypertensive med prn  Joyelle Siedlecki A Lesta Limbert 06/25/2019, 12:25 AM

## 2019-06-25 NOTE — Anesthesia Procedure Notes (Signed)
Epidural Patient location during procedure: OB Start time: 06/25/2019 12:07 AM End time: 06/25/2019 12:15 AM  Staffing Anesthesiologist: Josephine Igo, MD Performed: anesthesiologist   Preanesthetic Checklist Completed: patient identified, site marked, surgical consent, pre-op evaluation, timeout performed, IV checked, risks and benefits discussed and monitors and equipment checked  Epidural Patient position: sitting Prep: site prepped and draped and DuraPrep Patient monitoring: continuous pulse ox and blood pressure Approach: midline Location: L3-L4 Injection technique: LOR air  Needle:  Needle type: Tuohy  Needle gauge: 17 G Needle length: 9 cm and 9 Needle insertion depth: 6 cm Catheter type: closed end flexible Catheter size: 19 Gauge Catheter at skin depth: 11 cm Test dose: negative and Other  Assessment Events: blood not aspirated, injection not painful, no injection resistance, negative IV test and no paresthesia  Additional Notes Patient identified. Risks and benefits discussed including failed block, incomplete  Pain control, post dural puncture headache, nerve damage, paralysis, blood pressure Changes, nausea, vomiting, reactions to medications-both toxic and allergic and post Partum back pain. All questions were answered. Patient expressed understanding and wished to proceed. Sterile technique was used throughout procedure. Epidural site was Dressed with sterile barrier dressing. No paresthesias, signs of intravascular injection Or signs of intrathecal spread were encountered.  Patient was more comfortable after the epidural was dosed. Please see RN's note for documentation of vital signs and FHR which are stable. Reason for block:procedure for pain

## 2019-06-25 NOTE — Lactation Note (Signed)
This note was copied from a baby's chart. Lactation Consultation Note  Patient Name: Shannon Montoya OHYWV'P Date: 06/25/2019   Lactation visit attempted when infant was 66 hrs old, but Mom appeared to be sleeping soundly. Dad was holding infant.   Matthias Hughs [facility] 06/25/2019, 1:28 PM

## 2019-06-26 LAB — COMPREHENSIVE METABOLIC PANEL
ALT: 17 U/L (ref 0–44)
AST: 28 U/L (ref 15–41)
Albumin: 2.7 g/dL — ABNORMAL LOW (ref 3.5–5.0)
Alkaline Phosphatase: 136 U/L — ABNORMAL HIGH (ref 38–126)
Anion gap: 11 (ref 5–15)
BUN: 10 mg/dL (ref 6–20)
CO2: 21 mmol/L — ABNORMAL LOW (ref 22–32)
Calcium: 8.2 mg/dL — ABNORMAL LOW (ref 8.9–10.3)
Chloride: 105 mmol/L (ref 98–111)
Creatinine, Ser: 0.93 mg/dL (ref 0.44–1.00)
GFR calc Af Amer: >60 mL/min (ref >60)
GFR calc non Af Amer: >60 mL/min (ref >60)
Glucose, Bld: 87 mg/dL (ref 70–99)
Potassium: 3.9 mmol/L (ref 3.5–5.1)
Sodium: 137 mmol/L (ref 135–145)
Total Bilirubin: 0.5 mg/dL (ref 0.3–1.2)
Total Protein: 5.8 g/dL — ABNORMAL LOW (ref 6.5–8.1)

## 2019-06-26 LAB — ABO/RH: ABO/RH(D): AB POS

## 2019-06-26 LAB — CBC
HCT: 31.4 % — ABNORMAL LOW (ref 36.0–46.0)
Hemoglobin: 10.5 g/dL — ABNORMAL LOW (ref 12.0–15.0)
MCH: 31.6 pg (ref 26.0–34.0)
MCHC: 33.4 g/dL (ref 30.0–36.0)
MCV: 94.6 fL (ref 80.0–100.0)
Platelets: 278 10*3/uL (ref 150–400)
RBC: 3.32 MIL/uL — ABNORMAL LOW (ref 3.87–5.11)
RDW: 13.8 % (ref 11.5–15.5)
WBC: 15.5 10*3/uL — ABNORMAL HIGH (ref 4.0–10.5)
nRBC: 0 % (ref 0.0–0.2)

## 2019-06-26 MED ORDER — LABETALOL HCL 100 MG PO TABS
100.0000 mg | ORAL_TABLET | Freq: Two times a day (BID) | ORAL | Status: DC
  Administered 2019-06-26 – 2019-06-27 (×3): 100 mg via ORAL
  Filled 2019-06-26 (×3): qty 1

## 2019-06-26 NOTE — Lactation Note (Signed)
This note was copied from a baby's chart. Lactation Consultation Note  Patient Name: Shannon Montoya MCNOB'S Date: 06/26/2019 Reason for consult: Follow-up assessment  P4 mother whose infant is now 6 hours old.  Mother breast fed her other children ( now 37 year, 49 years and 35 years old) for 2 months each.  Mother's feeding preference with this baby is breast/bottle.  Mother had no questions/concerns related to breast feeding.  Encouraged to continue feeding 8-12 times/24 hours or sooner if baby shows cues.  Mother is able to hand express and knows to feed back any EBM to baby.  She will call for latch assistance as needed.  Mother feels like latching is going well and denies pain with feeding.  She states her nipples are intact and knows to use coconut oil as desired.  Father present.   Maternal Data    Feeding    LATCH Score                   Interventions    Lactation Tools Discussed/Used     Consult Status Consult Status: Follow-up Date: 06/27/19 Follow-up type: In-patient    Claudio Mondry R [place] 06/26/2019, 2:27 PM

## 2019-06-26 NOTE — Progress Notes (Addendum)
PPD #1, SVD, intact perineum, baby girl "[place]"  S:  Reports feeling good, desires discharge this afternoon as baby is going to be discharge home  Denies HA, visual changes, RUQ/epigastric pain              Tolerating po/ No nausea or vomiting / Denies dizziness or SOB             Bleeding is light             Pain controlled with Motrin and Tylenol             Up ad lib / ambulatory / voiding QS  Newborn breast feeding with some formula supplementation - going well, seeing colostrum   O:               VS: BP (!) 148/92 (BP Location: Left Arm)   Pulse 72   Temp 98 F (36.7 C) (Oral)   Resp 18   Ht 5\' 5"  (1.651 m)   Wt 102 kg   SpO2 100%   Breastfeeding Unknown   BMI 37.42 kg/m  06/26/19 0900  -  -  -  -  148/92Abnormal   Semi-fowlers  -  -  - MM   06/26/19 0530  98 F (36.7 C)  72  -  18  141/87Abnormal   Semi-fowlers  100 %  -  - EL   06/25/19 2300  98.8 F (37.1 C)  69  -  18  168/79Abnormal   Semi-fowlers  -  -  - MH   06/25/19 1600  97.4 F (36.3 C)Abnormal   74  -  18  129/87  Sitting  100 %  -  - SO   06/25/19 1215  98.4 F (36.9 C)  88  -  16  120/79  Semi-fowlers  100 %  -  - SO   06/25/19 0845  98 F (36.7 C)  81  -  18  125/72  Semi-fowlers  100 %  -  - SO   06/25/19 0418  99.5 F (37.5 C)  90  -  18  148/89Abnormal   Semi-fowlers  100 %  -  - EL   06/25/19 0320  98.6 F (37 C)  87  -  18  134/80  Semi-fowlers  100 %  -  - EL      LABS:              Recent Labs    06/24/19 2304 06/26/19 0547  WBC 21.0* 15.5*  HGB 12.1 10.5*  PLT 274 278               Blood type: --/--/AB POS (10/20 2315)  Rubella:                       I&O: Intake/Output      10/21 0701 - 10/22 0700 10/22 0701 - 10/23 0700   I.V. (mL/kg)     Other     IV Piggyback     Total Intake(mL/kg)     Urine (mL/kg/hr)     Blood     Total Output     Net                        Physical Exam:             Alert and oriented X3  Lungs: Clear and unlabored  Heart: regular rate and rhythm /  no murmurs  Abdomen: soft, non-tender, non-distended              Fundus: firm, non-tender, U-E/+1 slightly displaced to the right (bladder distention noted)  Perineum: intact  Lochia: small rubra  Extremities: +1 LE edema, no calf pain or tenderness    A/P: PPD # 1, SVD  Gestational Hypertension     - Labile BPs, but noted that the regular size cuff was too small   - Labs stable, no neural s/s or evidence of PEC, add CMP which was WNL   - Begin Labetalol 100mg  BID   ABL Anemia   - stable on oral FE, continue home iron supplement daily x 6 weeks  Doing well - stable status  Routine post partum orders  Recommend repeat BP at 2pm with large cuff to reassess for d/c home  WOB discharge book given and instructions and warning s/s  F/u with Dr. in 1 week for BP check   Consulted Dr. Cherly Hensen for plan of care  Cherly Hensen, MSN, CNM [facility]   Addendum at 1530: Peds not going to d/c baby home.  We will plan to keep mom inpatient as well and follow BPs.   Carlean Jews, CNM

## 2019-06-27 MED ORDER — IBUPROFEN 600 MG PO TABS: 600.0000 mg | ORAL_TABLET | Freq: Four times a day (QID) | ORAL | 0 refills | Status: DC

## 2019-06-27 MED ORDER — SENNOSIDES-DOCUSATE SODIUM 8.6-50 MG PO TABS: 2.0000 | ORAL_TABLET | Freq: Every evening | ORAL | 0 refills | Status: DC | PRN

## 2019-06-27 MED ORDER — LABETALOL HCL 100 MG PO TABS: 100.0000 mg | ORAL_TABLET | Freq: Two times a day (BID) | ORAL | 2 refills | Status: DC

## 2019-06-27 NOTE — Discharge Instructions (Signed)

## 2019-06-27 NOTE — Lactation Note (Signed)
This note was copied from a baby's chart. Lactation Consultation Note:  Mother bottle feeding infant when I arrived to room. Mother reports that she is having severe uterine cramping when she breastfeeds.  She reports that OB made suggestions for meds.  Advised mother to continue to hand express and pump every 2-3 hours for 15 mins to protect her milk supplly.  Mother was given a harmony hand pump with instructions and she reports that she has a IT sales professional pump at home.   Mother has a 73 month old at home. Offered some support and advised that she nap when children are napping . Reviewed S/S of Mastitis.   Discussed treatment and prevention of engorgement.   Advised mother to continue to cue base feed . Mother is aware of available [place] services and community support.   Patient Name: Shannon Montoya OEHOZ'Y Date: 06/27/2019 Reason for consult: Follow-up assessment   Maternal Data    Feeding Feeding Type: Formula Nipple Type: Slow - flow  LATCH Score                   Interventions Interventions: Hand pump;DEBP;Ice  Lactation Tools Discussed/Used     Consult Status Consult Status: Complete    Darla Lesches 06/27/2019, 10:30 AM

## 2019-06-27 NOTE — Discharge Summary (Signed)
Obstetric Discharge Summary Reason for Admission: onset of labor at term  Prenatal Procedures: ultrasound and 17-OHP injections for h/o preterm delivery Intrapartum Procedures: spontaneous vaginal delivery Postpartum Procedures: none Complications-Operative and Postpartum: elevated BP PP, started Labetalol 100mg  po bid  Hemoglobin  Date Value Ref Range Status  06/26/2019 10.5 (L) 12.0 - 15.0 g/dL Final   HCT  Date Value Ref Range Status  06/26/2019 31.4 (L) 36.0 - 46.0 % Final    Physical Exam:  General: alert and cooperative Lochia: appropriate Uterine Fundus: firm Incision: N/A DVT Evaluation: No evidence of DVT seen on physical exam.  Discharge Diagnoses: Term Pregnancy-delivered  Discharge Information: Date: 06/27/2019 Activity: pelvic rest Diet: routine Medications: Labetalol 100mg  po BID, PNvit Condition: stable Instructions: refer to practice specific booklet Discharge to: home Follow-up Information    Servando Salina, MD. Schedule an appointment as soon as possible for a visit in 1 week(s).   Specialty: Obstetrics and Gynecology Why: Blood pressure check  Contact information: [address] [place] [facility] [address] [phone]           Newborn Data: Live born female  Birth Weight: 6 lb 4.4 oz (2846 g) APGAR: 85, 28  Newborn Delivery   Birth date/time: 06/25/2019 01:12:00 Delivery type: Vaginal, Spontaneous      Home with mother.  Shannon Montoya 06/27/2019, 10:20 AM

## 2019-06-27 NOTE — Progress Notes (Signed)
Post Partum Day 2, late discharge since GBS+ with no labor coverage due to rapid delivery  Subjective: no complaints, up ad lib, voiding, tolerating PO and + flatus  Objective: Blood pressure 136/90, pulse 72, temperature 98.8 F (37.1 C), temperature source Oral, resp. rate 16, height 5\' 5"  (1.651 m), weight 102 kg, SpO2 99 %, unknown if currently breastfeeding.  Patient Vitals for the past 24 hrs:  BP Temp Temp src Pulse Resp SpO2  06/27/19 0558 136/90 98.8 F (37.1 C) Oral 72 16 99 %  06/26/19 2148 135/88 98 F (36.7 C) Oral 80 18 100 %  06/26/19 1509 (!) 142/88 98.1 F (36.7 C) Oral 70 18 -    Physical Exam:  General: alert and cooperative Lochia: appropriate Uterine Fundus: firm Perineum NAD DVT Evaluation: No evidence of DVT seen on physical exam. DTR +1  CBC Latest Ref Rng & Units 06/26/2019 06/24/2019 06/17/2018  WBC 4.0 - 10.5 K/uL 15.5(H) 21.0(H) 14.5(H)  Hemoglobin 12.0 - 15.0 g/dL 10.5(L) 12.1 9.6(L)  Hematocrit 36.0 - 46.0 % 31.4(L) 35.6(L) 28.0(L)  Platelets 150 - 400 K/uL 278 274 206   CMP Latest Ref Rng & Units 06/26/2019 06/24/2019  Glucose 70 - 99 mg/dL 87 101(H)  BUN 6 - 20 mg/dL 10 8  Creatinine 0.44 - 1.00 mg/dL 0.93 0.69  Sodium 135 - 145 mmol/L 137 136  Potassium 3.5 - 5.1 mmol/L 3.9 4.1  Chloride 98 - 111 mmol/L 105 107  CO2 22 - 32 mmol/L 21(L) 15(L)  Calcium 8.9 - 10.3 mg/dL 8.2(L) 8.7(L)  Total Protein 6.5 - 8.1 g/dL 5.8(L) 7.0  Total Bilirubin 0.3 - 1.2 mg/dL 0.5 0.6  Alkaline Phos 38 - 126 U/L 136(H) 171(H)  AST 15 - 41 U/L 28 26  ALT 0 - 44 U/L 17 16   Assessment/Plan: 33 yo, SVD, rapid birth. PPD#2.  Discharge home.  F/up office 1 week for BP check, warning s/s reviewed, will taper off Labetalol if BP <110s/ 70s and call if 150/100 or higher or if symptoms- reviewed warning s/s    LOS: 3 days   Elveria Royals 06/27/2019, 10:11 AM

## 2019-08-07 ENCOUNTER — Other Ambulatory Visit: Payer: Self-pay | Admitting: Obstetrics and Gynecology

## 2019-08-13 ENCOUNTER — Other Ambulatory Visit: Payer: Self-pay

## 2019-08-13 ENCOUNTER — Encounter (HOSPITAL_BASED_OUTPATIENT_CLINIC_OR_DEPARTMENT_OTHER): Payer: Self-pay | Admitting: *Deleted

## 2019-08-13 NOTE — Progress Notes (Addendum)
Spoke w/ via phone for pre-op interview---Sarissa COVID test ------08-15-2019     labs needed: urine preg, I stat 8, ekg Records on chart cbc 08-05-2019 dr cousins on chart Arrive at -------530 am 08-19-2019 NPO after ------midnight Medications to take morning of surgery -----labetalol Diabetic medication -----n/a Patient Special Instructions ----- Pre-Op special Istructions ----- Patient verbalized understanding of instructions that were given at this phone interview. Patient denies shortness of breath, chest pain, fever, cough a this phone interview.

## 2019-08-15 ENCOUNTER — Other Ambulatory Visit (HOSPITAL_COMMUNITY)
Admission: RE | Admit: 2019-08-15 | Discharge: 2019-08-15 | Disposition: A | Discharge status: Home / Self Care | Payer: BC Managed Care – PPO | Source: Ambulatory Visit | Attending: Obstetrics and Gynecology | Admitting: Obstetrics and Gynecology

## 2019-08-15 DIAGNOSIS — Z20828 Contact with and (suspected) exposure to other viral communicable diseases: Secondary | ICD-10-CM | POA: Insufficient documentation

## 2019-08-15 DIAGNOSIS — Z01812 Encounter for preprocedural laboratory examination: Secondary | ICD-10-CM | POA: Insufficient documentation

## 2019-08-16 LAB — NOVEL CORONAVIRUS, NAA (HOSP ORDER, SEND-OUT TO REF LAB; TAT 18-24 HRS): SARS-CoV-2, NAA: NOT DETECTED

## 2019-08-18 NOTE — Anesthesia Preprocedure Evaluation (Addendum)
Anesthesia Evaluation  Patient identified by MRN, date of birth, ID band Patient awake    Reviewed: Allergy & Precautions, NPO status , Patient's Chart, lab work & pertinent test results, reviewed documented beta blocker date and time   Airway Mallampati: III  TM Distance: >3 FB Neck ROM: Full    Dental no notable dental hx. (+) Teeth Intact, Dental Advisory Given   Pulmonary Current Smoker and Patient abstained from smoking.,  2.75 pack year history   Pulmonary exam normal breath sounds clear to auscultation       Cardiovascular hypertension, Pt. on medications and Pt. on home beta blockers Normal cardiovascular exam Rhythm:Regular Rate:Normal     Neuro/Psych negative neurological ROS  negative psych ROS   GI/Hepatic negative GI ROS, Neg liver ROS,   Endo/Other  Obesity BMI 33  Renal/GU negative Renal ROS  negative genitourinary   Musculoskeletal negative musculoskeletal ROS (+)   Abdominal (+) + obese,   Peds  Hematology  (+) anemia ,   Anesthesia Other Findings   Reproductive/Obstetrics Desires sterilization                            Anesthesia Physical  Anesthesia Plan  ASA: II  Anesthesia Plan: General   Post-op Pain Management:    Induction: Intravenous  PONV Risk Score and Plan: 2 and Ondansetron, Dexamethasone, Midazolam, Scopolamine patch - Pre-op and Treatment may vary due to age or medical condition  Airway Management Planned: Oral ETT  Additional Equipment: None  Intra-op Plan:   Post-operative Plan: Extubation in OR  Informed Consent: I have reviewed the patients History and Physical, chart, labs and discussed the procedure including the risks, benefits and alternatives for the proposed anesthesia with the patient or authorized representative who has indicated his/her understanding and acceptance.     Dental advisory given  Plan Discussed with:  CRNA  Anesthesia Plan Comments:         Anesthesia Quick Evaluation

## 2019-08-19 ENCOUNTER — Ambulatory Visit (HOSPITAL_BASED_OUTPATIENT_CLINIC_OR_DEPARTMENT_OTHER): Payer: BC Managed Care – PPO | Admitting: Anesthesiology

## 2019-08-19 ENCOUNTER — Encounter (HOSPITAL_BASED_OUTPATIENT_CLINIC_OR_DEPARTMENT_OTHER): Payer: Self-pay | Admitting: Obstetrics and Gynecology

## 2019-08-19 ENCOUNTER — Ambulatory Visit (HOSPITAL_BASED_OUTPATIENT_CLINIC_OR_DEPARTMENT_OTHER)
Admission: RE | Admit: 2019-08-19 | Discharge: 2019-08-19 | Disposition: A | Discharge status: Home / Self Care | Payer: BC Managed Care – PPO | Attending: Obstetrics and Gynecology | Admitting: Obstetrics and Gynecology

## 2019-08-19 ENCOUNTER — Other Ambulatory Visit: Payer: Self-pay

## 2019-08-19 ENCOUNTER — Encounter (HOSPITAL_BASED_OUTPATIENT_CLINIC_OR_DEPARTMENT_OTHER)
Admission: RE | Disposition: A | Discharge status: Home / Self Care | Payer: Self-pay | Source: Home / Self Care | Attending: Obstetrics and Gynecology

## 2019-08-19 DIAGNOSIS — Z302 Encounter for sterilization: Secondary | ICD-10-CM | POA: Diagnosis present

## 2019-08-19 DIAGNOSIS — F17210 Nicotine dependence, cigarettes, uncomplicated: Secondary | ICD-10-CM | POA: Insufficient documentation

## 2019-08-19 DIAGNOSIS — D649 Anemia, unspecified: Secondary | ICD-10-CM | POA: Insufficient documentation

## 2019-08-19 DIAGNOSIS — Z79899 Other long term (current) drug therapy: Secondary | ICD-10-CM | POA: Diagnosis not present

## 2019-08-19 DIAGNOSIS — I10 Essential (primary) hypertension: Secondary | ICD-10-CM | POA: Diagnosis not present

## 2019-08-19 HISTORY — DX: Anemia, unspecified: D64.9

## 2019-08-19 HISTORY — DX: Essential (primary) hypertension: I10

## 2019-08-19 HISTORY — PX: LAPAROSCOPIC TUBAL LIGATION: SHX1937

## 2019-08-19 LAB — POCT I-STAT, CHEM 8
BUN: 6 mg/dL (ref 6–20)
Calcium, Ion: 1.22 mmol/L (ref 1.15–1.40)
Chloride: 105 mmol/L (ref 98–111)
Creatinine, Ser: 1 mg/dL (ref 0.44–1.00)
Glucose, Bld: 94 mg/dL (ref 70–99)
HCT: 35 % — ABNORMAL LOW (ref 36.0–46.0)
Hemoglobin: 11.9 g/dL — ABNORMAL LOW (ref 12.0–15.0)
Potassium: 3.2 mmol/L — ABNORMAL LOW (ref 3.5–5.1)
Sodium: 142 mmol/L (ref 135–145)
TCO2: 25 mmol/L (ref 22–32)

## 2019-08-19 LAB — POCT PREGNANCY, URINE: Preg Test, Ur: NEGATIVE

## 2019-08-19 SURGERY — LIGATION, FALLOPIAN TUBE, LAPAROSCOPIC
Anesthesia: General | Site: Abdomen | Laterality: Bilateral

## 2019-08-19 MED ORDER — DEXAMETHASONE SODIUM PHOSPHATE 10 MG/ML IJ SOLN
INTRAMUSCULAR | Status: DC | PRN
  Administered 2019-08-19: 5 mg via INTRAVENOUS

## 2019-08-19 MED ORDER — FENTANYL CITRATE (PF) 100 MCG/2ML IJ SOLN
INTRAMUSCULAR | Status: AC
  Filled 2019-08-19: qty 2

## 2019-08-19 MED ORDER — PHENYLEPHRINE 40 MCG/ML (10ML) SYRINGE FOR IV PUSH (FOR BLOOD PRESSURE SUPPORT)
PREFILLED_SYRINGE | INTRAVENOUS | Status: AC
  Filled 2019-08-19: qty 10

## 2019-08-19 MED ORDER — FENTANYL CITRATE (PF) 100 MCG/2ML IJ SOLN
INTRAMUSCULAR | Status: DC | PRN
  Administered 2019-08-19 (×2): 50 ug via INTRAVENOUS
  Administered 2019-08-19: 100 ug via INTRAVENOUS

## 2019-08-19 MED ORDER — MIDAZOLAM HCL 2 MG/2ML IJ SOLN
INTRAMUSCULAR | Status: DC | PRN
  Administered 2019-08-19: 2 mg via INTRAVENOUS

## 2019-08-19 MED ORDER — SUGAMMADEX SODIUM 200 MG/2ML IV SOLN
INTRAVENOUS | Status: DC | PRN
  Administered 2019-08-19: 180 mg via INTRAVENOUS

## 2019-08-19 MED ORDER — MIDAZOLAM HCL 2 MG/2ML IJ SOLN
INTRAMUSCULAR | Status: AC
  Filled 2019-08-19: qty 2

## 2019-08-19 MED ORDER — MEPERIDINE HCL 25 MG/ML IJ SOLN
6.2500 mg | INTRAMUSCULAR | Status: DC | PRN
  Filled 2019-08-19: qty 1

## 2019-08-19 MED ORDER — ACETAMINOPHEN 500 MG PO TABS
ORAL_TABLET | ORAL | Status: AC
  Filled 2019-08-19: qty 2

## 2019-08-19 MED ORDER — ONDANSETRON HCL 4 MG/2ML IJ SOLN
INTRAMUSCULAR | Status: DC | PRN
  Administered 2019-08-19: 4 mg via INTRAVENOUS

## 2019-08-19 MED ORDER — SCOPOLAMINE 1 MG/3DAYS TD PT72
MEDICATED_PATCH | TRANSDERMAL | Status: AC
  Filled 2019-08-19: qty 1

## 2019-08-19 MED ORDER — OXYCODONE HCL 5 MG/5ML PO SOLN
5.0000 mg | Freq: Once | ORAL | Status: DC | PRN
  Filled 2019-08-19: qty 5

## 2019-08-19 MED ORDER — KETOROLAC TROMETHAMINE 30 MG/ML IJ SOLN
INTRAMUSCULAR | Status: DC | PRN
  Administered 2019-08-19: 30 mg via INTRAVENOUS

## 2019-08-19 MED ORDER — BUPIVACAINE HCL (PF) 0.25 % IJ SOLN
INTRAMUSCULAR | Status: DC | PRN
  Administered 2019-08-19: 6 mL

## 2019-08-19 MED ORDER — KETOROLAC TROMETHAMINE 30 MG/ML IJ SOLN
INTRAMUSCULAR | Status: AC
  Filled 2019-08-19: qty 1

## 2019-08-19 MED ORDER — LACTATED RINGERS IV SOLN
INTRAVENOUS | Status: DC
  Filled 2019-08-19 (×2): qty 1000

## 2019-08-19 MED ORDER — PROMETHAZINE HCL 25 MG/ML IJ SOLN
6.2500 mg | INTRAMUSCULAR | Status: DC | PRN
  Filled 2019-08-19: qty 1

## 2019-08-19 MED ORDER — PROPOFOL 10 MG/ML IV BOLUS
INTRAVENOUS | Status: AC
  Filled 2019-08-19: qty 40

## 2019-08-19 MED ORDER — ONDANSETRON HCL 4 MG/2ML IJ SOLN
INTRAMUSCULAR | Status: AC
  Filled 2019-08-19: qty 2

## 2019-08-19 MED ORDER — PROPOFOL 10 MG/ML IV BOLUS
INTRAVENOUS | Status: DC | PRN
  Administered 2019-08-19: 50 mg via INTRAVENOUS
  Administered 2019-08-19: 200 mg via INTRAVENOUS

## 2019-08-19 MED ORDER — LIDOCAINE 2% (20 MG/ML) 5 ML SYRINGE
INTRAMUSCULAR | Status: AC
  Filled 2019-08-19: qty 5

## 2019-08-19 MED ORDER — PHENYLEPHRINE HCL (PRESSORS) 10 MG/ML IV SOLN
INTRAVENOUS | Status: DC | PRN
  Administered 2019-08-19: 40 ug via INTRAVENOUS

## 2019-08-19 MED ORDER — DEXAMETHASONE SODIUM PHOSPHATE 10 MG/ML IJ SOLN
INTRAMUSCULAR | Status: AC
  Filled 2019-08-19: qty 1

## 2019-08-19 MED ORDER — KETOROLAC TROMETHAMINE 30 MG/ML IJ SOLN
30.0000 mg | Freq: Once | INTRAMUSCULAR | Status: DC | PRN
  Filled 2019-08-19: qty 1

## 2019-08-19 MED ORDER — ROCURONIUM BROMIDE 10 MG/ML (PF) SYRINGE
PREFILLED_SYRINGE | INTRAVENOUS | Status: AC
  Filled 2019-08-19: qty 10

## 2019-08-19 MED ORDER — ACETAMINOPHEN 500 MG PO TABS
1000.0000 mg | ORAL_TABLET | Freq: Once | ORAL | Status: AC
  Administered 2019-08-19: 07:00:00 1000 mg via ORAL
  Filled 2019-08-19: qty 2

## 2019-08-19 MED ORDER — LIDOCAINE HCL (CARDIAC) PF 100 MG/5ML IV SOSY
PREFILLED_SYRINGE | INTRAVENOUS | Status: DC | PRN
  Administered 2019-08-19: 100 mg via INTRAVENOUS

## 2019-08-19 MED ORDER — HYDROMORPHONE HCL 1 MG/ML IJ SOLN
0.2500 mg | INTRAMUSCULAR | Status: DC | PRN
  Filled 2019-08-19: qty 0.5

## 2019-08-19 MED ORDER — SCOPOLAMINE 1 MG/3DAYS TD PT72
1.0000 | MEDICATED_PATCH | TRANSDERMAL | Status: DC
  Administered 2019-08-19: 1.5 mg via TRANSDERMAL
  Filled 2019-08-19: qty 1

## 2019-08-19 MED ORDER — OXYCODONE HCL 5 MG PO TABS
5.0000 mg | ORAL_TABLET | Freq: Once | ORAL | Status: DC | PRN
  Filled 2019-08-19: qty 1

## 2019-08-19 MED ORDER — ROCURONIUM BROMIDE 10 MG/ML (PF) SYRINGE
PREFILLED_SYRINGE | INTRAVENOUS | Status: DC | PRN
  Administered 2019-08-19: 50 mg via INTRAVENOUS

## 2019-08-19 SURGICAL SUPPLY — 27 items
ADH SKN CLS APL DERMABOND .7 (GAUZE/BANDAGES/DRESSINGS) ×1
APL SWBSTK 6 STRL LF DISP (MISCELLANEOUS) ×1
APPLICATOR COTTON TIP 6 STRL (MISCELLANEOUS) IMPLANT
APPLICATOR COTTON TIP 6IN STRL (MISCELLANEOUS) ×3
CATH ROBINSON RED A/P 16FR (CATHETERS) ×3 IMPLANT
DERMABOND ADVANCED (GAUZE/BANDAGES/DRESSINGS) ×2
DERMABOND ADVANCED .7 DNX12 (GAUZE/BANDAGES/DRESSINGS) ×1 IMPLANT
DRSG OPSITE POSTOP 3X4 (GAUZE/BANDAGES/DRESSINGS) IMPLANT
DURAPREP 26ML APPLICATOR (WOUND CARE) ×3 IMPLANT
GLOVE BIOGEL PI IND STRL 7.0 (GLOVE) ×2 IMPLANT
GLOVE BIOGEL PI INDICATOR 7.0 (GLOVE) ×8
GLOVE ECLIPSE 6.5 STRL STRAW (GLOVE) ×3 IMPLANT
GOWN STRL REUS W/ TWL LRG LVL3 (GOWN DISPOSABLE) ×2 IMPLANT
GOWN STRL REUS W/TWL LRG LVL3 (GOWN DISPOSABLE) ×6
KIT TURNOVER CYSTO (KITS) ×3 IMPLANT
NEEDLE INSUFFLATION 14GA 120MM (NEEDLE) ×3 IMPLANT
PACK LAPAROSCOPY BASIN (CUSTOM PROCEDURE TRAY) ×3 IMPLANT
PACK TRENDGUARD 450 HYBRID PRO (MISCELLANEOUS) IMPLANT
PROTECTOR NERVE ULNAR (MISCELLANEOUS) ×4 IMPLANT
SET TUBE SMOKE EVAC HIGH FLOW (TUBING) ×3 IMPLANT
SLEEVE ENDOPATH XCEL 5M (ENDOMECHANICALS) ×1 IMPLANT
SUT VICRYL 0 UR6 27IN ABS (SUTURE) ×3 IMPLANT
SUT VICRYL 4-0 PS2 18IN ABS (SUTURE) ×3 IMPLANT
TRENDGUARD 450 HYBRID PRO PACK (MISCELLANEOUS)
TROCAR OPTI TIP 5M 100M (ENDOMECHANICALS) ×2 IMPLANT
TROCAR XCEL DIL TIP R 11M (ENDOMECHANICALS) ×3 IMPLANT
WARMER LAPAROSCOPE (MISCELLANEOUS) ×3 IMPLANT

## 2019-08-19 NOTE — Anesthesia Procedure Notes (Signed)
Procedure Name: Intubation Date/Time: 08/19/2019 7:54 AM Performed by: Raenette Rover, CRNA Pre-anesthesia Checklist: Patient identified, Emergency Drugs available, Suction available and Patient being monitored Patient Re-evaluated:Patient Re-evaluated prior to induction Oxygen Delivery Method: Circle system utilized Preoxygenation: Pre-oxygenation with 100% oxygen Induction Type: IV induction Ventilation: Mask ventilation without difficulty Laryngoscope Size: Mac and 3 Grade View: Grade I Tube type: Oral Tube size: 7.0 mm Number of attempts: 1 Airway Equipment and Method: Stylet Placement Confirmation: ETT inserted through vocal cords under direct vision,  positive ETCO2 and breath sounds checked- equal and bilateral Secured at: 21 cm Tube secured with: Tape Dental Injury: Teeth and Oropharynx as per pre-operative assessment

## 2019-08-19 NOTE — Discharge Instructions (Signed)
Post Anesthesia Home Care Instructions  Activity: Get plenty of rest for the remainder of the day. A responsible individual must stay with you for 24 hours following the procedure.  For the next 24 hours, DO NOT: -Drive a car -Paediatric nurse -Drink alcoholic beverages -Take any medication unless instructed by your physician -Make any legal decisions or sign important papers.  Meals: Start with liquid foods such as gelatin or soup. Progress to regular foods as tolerated. Avoid greasy, spicy, heavy foods. If nausea and/or vomiting occur, drink only clear liquids until the nausea and/or vomiting subsides. Call your physician if vomiting continues.  Special Instructions/Symptoms: Your throat may feel dry or sore from the anesthesia or the breathing tube placed in your throat during surgery. If this causes discomfort, gargle with warm salt water. The discomfort should disappear within 24 hours.  If you had a scopolamine patch placed behind your ear for the management of post- operative nausea and/or vomiting:  1. The medication in the patch is effective for 72 hours, after which it should be removed.  Wrap patch in a tissue and discard in the trash. Wash hands thoroughly with soap and water. 2. You may remove the patch earlier than 72 hours if you experience unpleasant side effects which may include dry mouth, dizziness or visual disturbances. 3. Avoid touching the patch. Wash your hands with soap and water after contact with the patch.    DISCHARGE INSTRUCTIONS: Laparoscopy  The following instructions have been prepared to help you care for yourself upon your return home today.  Wound care:  Do not get the incision wet for the first 24 hours. The incision should be kept clean and dry.  The Band-Aids or dressings may be removed the day after surgery.  Should the incision become sore, red, and swollen after the first week, check with your doctor.  Personal hygiene:  Shower the day  after your procedure.  Activity and limitations:  Do NOT drive or operate any equipment today.  Do NOT lift anything more than 15 pounds for 2-3 weeks after surgery.  Do NOT rest in bed all day.  Walking is encouraged. Walk each day, starting slowly with 5-minute walks 3 or 4 times a day. Slowly increase the length of your walks.  Walk up and down stairs slowly.  Do NOT do strenuous activities, such as golfing, playing tennis, bowling, running, biking, weight lifting, gardening, mowing, or vacuuming for 2-4 weeks. Ask your doctor when it is okay to start.  Diet: Eat a light meal as desired this evening. You may resume your usual diet tomorrow.  Return to work: This is dependent on the type of work you do. For the most part you can return to a desk job within a week of surgery. If you are more active at work, please discuss this with your doctor.  What to expect after your surgery: You may have a slight burning sensation when you urinate on the first day. You may have a very small amount of blood in the urine. Expect to have a small amount of vaginal discharge/light bleeding for 1-2 weeks. It is not unusual to have abdominal soreness and bruising for up to 2 weeks. You may be tired and need more rest for about 1 week. You may experience shoulder pain for 24-72 hours. Lying flat in bed may relieve it.  Call your doctor for any of the following:  Develop a fever of 100.4 or greater  Inability to urinate 6 hours after  discharge from hospital  Severe pain not relieved by pain medications  Persistent of heavy bleeding at incision site  Redness or swelling around incision site after a week  Increasing nausea or vomiting  May take Ibuprofen, Advil, Motrin, or Aleve after 2:30 PM.

## 2019-08-19 NOTE — Op Note (Signed)
NAME: Shannon Montoya, BOWLDS MEDICAL RECORD [id number] ACCOUNT [id number] DATE OF BIRTH:10/20/1985 FACILITY: [facility] LOCATION: [facility] PHYSICIAN:Raylin Diguglielmo A. Jettson Crable, MD  OPERATIVE REPORT  DATE OF PROCEDURE:  08/19/2019  PREOPERATIVE DIAGNOSIS:  Desires sterilization.  PROCEDURE:  Laparoscopic tubal ligation with bipolar cautery.  POSTOPERATIVE DIAGNOSIS:  Desires sterilization.  ANESTHESIA:  General.  SURGEON:  Servando Salina, MD  ASSISTANT:  None.  DESCRIPTION OF PROCEDURE:  Under adequate general anesthesia, the patient was placed in the dorsal lithotomy position.  She had been catheterized.  She was sterilely prepped and draped in the usual fashion.  Bivalve speculum was placed in the vagina.   Single tooth tenaculum was placed on the anterior lip of the cervix.  An acorn cannula was introduced into the cervical os and attached to the tenaculum for manipulation of the uterus.  Bivalve speculum was removed.  Attention was then turned to the  abdomen.  Marcaine 0.25% was injected infraumbilical.  Infraumbilical incision was then made.  A Veress needle was placed, tested with sterile water and 2.9 liters of CO2 was insufflated.  Veress needle was then removed.  The 10 mm disposable trocar with  sleeve was introduced into the abdomen without incident.  A lighted video laparoscope was placed.  Panoramic inspection was notable for normal liver edge.  The tip of the appendix was normal.  Uterus was normal.  Suprapubic incision was then made after 0.25% Marcaine was injected and under direct visualization, a 5 mm port was placed.  The anterior posterior cul-de-sac was without any lesions.  The midportion of both fallopian tubes was cauterized sequentially until it was felt to be adequate bilaterally.  There was a single omental adhesion from the anterior abdominal wall which was subsequently lysed and after that, the procedure was felt to be completed.  The suprapubic port was removed under  direct visualization.  The abdomen was desufflated and the infraumbilical port was removed under direct visualization taking care not to bring up any underlying structure.  Then 0 Vicryl figure-of-eight sutures were placed with a fascial stitch at the  infraumbilical site and the skin was approximated using 4-0 Vicryl subcuticular closure.  The instruments from the vagina were removed.  SPECIMEN:  None.  ESTIMATED BLOOD LOSS:  Minimal.  COMPLICATIONS:  None.  DISPOSITION:  The patient tolerated the procedure well and was transferred to recovery room in stable condition.  TN/NUANCE  D:08/19/2019 T:08/19/2019 JOB:[id number]

## 2019-08-19 NOTE — H&P (Signed)
Shannon Montoya is an 33 y.o. female G5P5 MBF here for surgical sterilization. Recently pp  Pertinent Gynecological History: Menses: postpartum Bleeding: n/a Contraception: none DES exposure: denies Blood transfusions: none Sexually transmitted diseases: no past history Previous GYN Procedures: hidradenitis  Last mammogram: n/a Date: n/a Last pap: normal Date: 2020 OB History: G5P5   Menstrual History: Menarche age: n/a Patient's last menstrual period was 08/12/2019.    Past Medical History:  Diagnosis Date  . Anemia   . Hidradenitis   . HPV (human papilloma virus) anogenital infection   . Hypertension   . Left axillary hidradenitis 05/2016  . Vaginal Pap smear, abnormal     Past Surgical History:  Procedure Laterality Date  . HYDRADENITIS EXCISION Right 03/27/2016   Procedure: EXCISION  HIDRADENITIS RIGHT AXILLA RYAN [place];  Surgeon: Cristine Polio, MD;  Location: [place];  Service: Plastics;  Laterality: Right;  . HYDRADENITIS EXCISION Left 06/05/2016   Procedure: EXCISION HIDRADENITIS left AXILLA with ryan pollock closure;  Surgeon: Cristine Polio, MD;  Location: [place];  Service: Plastics;  Laterality: Left;    Family History  Problem Relation Age of Onset  . Heart disease Mother   . Heart disease Father   . Hypertension Father   . Heart attack Father   . Breast cancer Paternal Grandmother   . Diabetes Paternal Grandmother     Social History:  reports that she has been smoking cigarettes. She has a 2.75 pack-year smoking history. She has never used smokeless tobacco. She reports that she does not drink alcohol or use drugs.  Allergies: No Known Allergies  Medications Prior to Admission  Medication Sig Dispense Refill Last Dose  . ferrous sulfate 325 (65 FE) MG tablet Take 325 mg by mouth daily with breakfast.   08/17/2019 at Unknown time  . labetalol (NORMODYNE) 100 MG tablet Take 1 tablet (100 mg total) by  mouth 2 (two) times daily. 60 tablet 2 08/19/2019 at 0430    Review of Systems  All other systems reviewed and are negative.   Height 5\' 5"  (1.651 m), weight 88 kg, last menstrual period 08/12/2019, unknown if currently breastfeeding. Physical Exam  Constitutional: She is oriented to person, place, and time. She appears well-developed and well-nourished.  HENT:  Head: Atraumatic.  Eyes: EOM are normal.  Cardiovascular: Regular rhythm.  Respiratory: Effort normal.  GI: Soft. Tenderness: n/a.  Genitourinary:    Vagina normal.     Genitourinary Comments: Cervix parous Adnexa nl Uterus sl enlarged   Musculoskeletal:     Cervical back: Neck supple.  Neurological: She is alert and oriented to person, place, and time.  Skin: Skin is warm and dry.  Psychiatric: She has a normal mood and affect.    Results for orders placed or performed during the hospital encounter of 08/19/19 (from the past 24 hour(s))  Pregnancy, urine POC     Status: None   Collection Time: 08/19/19  5:52 AM  Result Value Ref Range   Preg Test, Ur NEGATIVE NEGATIVE  I-STAT, chem 8     Status: Abnormal   Collection Time: 08/19/19  6:39 AM  Result Value Ref Range   Sodium 142 135 - 145 mmol/L   Potassium 3.2 (L) 3.5 - 5.1 mmol/L   Chloride 105 98 - 111 mmol/L   BUN 6 6 - 20 mg/dL   Creatinine, Ser 1.00 0.44 - 1.00 mg/dL   Glucose, Bld 94 70 - 99 mg/dL   Calcium, Ion 1.22  1.15 - 1.40 mmol/L   TCO2 25 22 - 32 mmol/L   Hemoglobin 11.9 (L) 12.0 - 15.0 g/dL   HCT 02.7 (L) 25.3 - 66.4 %    No results found.  Assessment/Plan: Desires sterilization P) LTL with bipolar cautery. Risk of surgery reviewed including infection, bleeding, injury to underlying structures, permanent sterilization, nonreversible, failure rate 1/500-1/600. All ? answered  Shannon Montoya 08/19/2019, 6:49 AM

## 2019-08-19 NOTE — Anesthesia Postprocedure Evaluation (Signed)
Anesthesia Post Note  Patient: Shannon Montoya  Procedure(s) Performed: LAPAROSCOPIC TUBAL LIGATION with Bipolar Cautery (Bilateral Abdomen)     Patient location during evaluation: PACU Anesthesia Type: General Level of consciousness: awake and alert, oriented and patient cooperative Pain management: pain level controlled Vital Signs Assessment: post-procedure vital signs reviewed and stable Respiratory status: spontaneous breathing, nonlabored ventilation and respiratory function stable Cardiovascular status: blood pressure returned to baseline and stable Postop Assessment: no apparent nausea or vomiting Anesthetic complications: no    Last Vitals:  Vitals:   08/19/19 0915 08/19/19 0930  BP: 110/69 114/82  Pulse: 89 79  Resp: 13 18  Temp:    SpO2: 97% 100%    Last Pain:  Vitals:   08/19/19 0902  TempSrc:   PainSc: [place]

## 2019-08-19 NOTE — Transfer of Care (Signed)
Immediate Anesthesia Transfer of Care Note  Patient: Shannon Montoya  Procedure(s) Performed: LAPAROSCOPIC TUBAL LIGATION with Bipolar Cautery (Bilateral Abdomen)  Patient Location: PACU  Anesthesia Type:General  Level of Consciousness: awake, alert , oriented, drowsy and patient cooperative  Airway & Oxygen Therapy: Patient Spontanous Breathing and Patient connected to nasal cannula oxygen  Post-op Assessment: Report given to RN and Post -op Vital signs reviewed and stable  Post vital signs: Reviewed and stable  Last Vitals:  Vitals Value Taken Time  BP    Temp    Pulse 71 08/19/19 0902  Resp 10 08/19/19 0902  SpO2 94 % 08/19/19 0902  Vitals shown include unvalidated device data.  Last Pain:  Vitals:   08/19/19 0649  TempSrc: Oral      Patients Stated Pain Goal: 5 (74/08/14 4818)  Complications: No apparent anesthesia complications

## 2019-10-21 ENCOUNTER — Encounter (HOSPITAL_COMMUNITY): Payer: Self-pay | Admitting: Emergency Medicine

## 2019-10-21 ENCOUNTER — Other Ambulatory Visit: Payer: Self-pay

## 2019-10-21 ENCOUNTER — Emergency Department (HOSPITAL_COMMUNITY)
Admission: EM | Admit: 2019-10-21 | Discharge: 2019-10-22 | Disposition: A | Discharge status: Home / Self Care | Payer: BC Managed Care – PPO | Attending: Emergency Medicine | Admitting: Emergency Medicine

## 2019-10-21 ENCOUNTER — Emergency Department (HOSPITAL_COMMUNITY): Payer: BC Managed Care – PPO

## 2019-10-21 DIAGNOSIS — Y939 Activity, unspecified: Secondary | ICD-10-CM | POA: Insufficient documentation

## 2019-10-21 DIAGNOSIS — I10 Essential (primary) hypertension: Secondary | ICD-10-CM | POA: Diagnosis not present

## 2019-10-21 DIAGNOSIS — Z79899 Other long term (current) drug therapy: Secondary | ICD-10-CM | POA: Insufficient documentation

## 2019-10-21 DIAGNOSIS — W208XXA Other cause of strike by thrown, projected or falling object, initial encounter: Secondary | ICD-10-CM | POA: Insufficient documentation

## 2019-10-21 DIAGNOSIS — F17210 Nicotine dependence, cigarettes, uncomplicated: Secondary | ICD-10-CM | POA: Diagnosis not present

## 2019-10-21 DIAGNOSIS — Y999 Unspecified external cause status: Secondary | ICD-10-CM | POA: Insufficient documentation

## 2019-10-21 DIAGNOSIS — S5001XA Contusion of right elbow, initial encounter: Secondary | ICD-10-CM | POA: Diagnosis not present

## 2019-10-21 DIAGNOSIS — S59901A Unspecified injury of right elbow, initial encounter: Secondary | ICD-10-CM | POA: Diagnosis present

## 2019-10-21 DIAGNOSIS — Y929 Unspecified place or not applicable: Secondary | ICD-10-CM | POA: Diagnosis not present

## 2019-10-21 NOTE — ED Provider Notes (Signed)
[facility] EMERGENCY DEPARTMENT Provider Note   CSN: [id number] Arrival date & time: 10/21/19  2155     History Chief Complaint  Patient presents with  . Elbow Injury    Shannon Montoya is a 34 y.o. female.   34 year old female presents to the ED for evaluation of right elbow pain.  States that her son threw a remote control from a toy car at her.  She raised her right arm to shield her face and the remote struck her elbow.  She has been experiencing pain which is aggravated with movement of the extremity.  Has not taken any medications for symptoms.  Reports that pain has been constant.  No extremity numbness or weakness.  The history is provided by the patient. No language interpreter was used.       Past Medical History:  Diagnosis Date  . Anemia   . Hidradenitis   . HPV (human papilloma virus) anogenital infection   . Hypertension   . Left axillary hidradenitis 05/2016  . Vaginal Pap smear, abnormal     Patient Active Problem List   Diagnosis Date Noted  . Encounter for induction of labor 06/24/2019  . Postpartum care following vaginal delivery (10/21) 06/16/2018  . SVD (spontaneous vaginal delivery) 06/16/2018  . Encounter for planned induction of labor 06/15/2018    Past Surgical History:  Procedure Laterality Date  . HYDRADENITIS EXCISION Right 03/27/2016   Procedure: EXCISION  HIDRADENITIS RIGHT AXILLA RYAN [place];  Surgeon: Louisa Second, MD;  Location: [facility];  Service: Plastics;  Laterality: Right;  . HYDRADENITIS EXCISION Left 06/05/2016   Procedure: EXCISION HIDRADENITIS left AXILLA with ryan pollock closure;  Surgeon: Louisa Second, MD;  Location: [facility];  Service: Plastics;  Laterality: Left;  . LAPAROSCOPIC TUBAL LIGATION Bilateral 08/19/2019   Procedure: LAPAROSCOPIC TUBAL LIGATION with Bipolar Cautery;  Surgeon: [name], MD;  Location: [facility] ;   Service: Gynecology;  Laterality: Bilateral;     OB History    Gravida  5   Para  4   Term  2   Preterm  2   AB  1   Living  4     SAB      TAB  1   Ectopic      Multiple  0   Live Births  4           Family History  Problem Relation Age of Onset  . Heart disease Mother   . Heart disease Father   . Hypertension Father   . Heart attack Father   . Breast cancer Paternal Grandmother   . Diabetes Paternal Grandmother     Social History   Tobacco Use  . Smoking status: Current Every Day Smoker    Packs/day: 0.25    Years: 11.00    Pack years: 2.75    Types: Cigarettes  . Smokeless tobacco: Never Used  . Tobacco comment: 2-3 cig./day  Substance Use Topics  . Alcohol use: No  . Drug use: No    Home Medications Prior to Admission medications   Medication Sig Start Date End Date Taking? Authorizing Provider  ferrous sulfate 325 (65 FE) MG tablet Take 325 mg by mouth daily with breakfast.    [provider]  labetalol (NORMODYNE) 100 MG tablet Take 1 tablet (100 mg total) by mouth 2 (two) times daily. 06/27/19   Shea Evans, MD    Allergies    Patient  has no known allergies.  Review of Systems   Review of Systems  Ten systems reviewed and are negative for acute change, except as noted in the HPI.    Physical Exam Updated Vital Signs BP 125/84   Pulse 84   Temp 98.5 F (36.9 C) (Oral)   Resp 18   LMP 10/17/2019   SpO2 100%   Physical Exam Vitals and nursing note reviewed.  Constitutional:      General: She is not in acute distress.    Appearance: She is well-developed. She is not diaphoretic.     Comments: Nontoxic-appearing and in no distress  HENT:     Head: Normocephalic and atraumatic.  Eyes:     General: No scleral icterus.    Conjunctiva/sclera: Conjunctivae normal.  Cardiovascular:     Rate and Rhythm: Normal rate and regular rhythm.     Pulses: Normal pulses.     Comments: Distal radial pulse 2+ in the RUE.  Capillary refill brisk in the digits of the R hand. Pulmonary:     Effort: Pulmonary effort is normal. No respiratory distress.  Musculoskeletal:        General: Normal range of motion.     Cervical back: Normal range of motion.     Comments: Preserved PROM of the RUE and elbow. No deformity or crepitus. Mild TTP over the olecranon. Minimal swelling. No contusion or hematoma.  Skin:    General: Skin is warm and dry.     Coloration: Skin is not pale.     Findings: No erythema or rash.  Neurological:     Mental Status: She is alert and oriented to person, place, and time.     Coordination: Coordination normal.     Comments: Sensation to light touch intact in the right upper extremity.  Patient able to wiggle all fingers.  Grip strength 5/5 in the right hand.  Psychiatric:        Behavior: Behavior normal.     ED Results / Procedures / Treatments   Labs (all labs ordered are listed, but only abnormal results are displayed) Labs Reviewed - No data to display  EKG None  Radiology DG Elbow Complete Right  Result Date: 10/21/2019 CLINICAL DATA:  Elbow injury with pain EXAM: RIGHT ELBOW - COMPLETE 3+ VIEW COMPARISON:  None. FINDINGS: There is no evidence of fracture, dislocation, or joint effusion. There is no evidence of arthropathy or other focal bone abnormality. Soft tissues are unremarkable. IMPRESSION: Negative. Electronically Signed   By: Jasmine Pang M.D.   On: 10/21/2019 22:28    Procedures Procedures (including critical care time)  Medications Ordered in ED Medications - No data to display  ED Course  I have reviewed the triage vital signs and the nursing notes.  Pertinent labs & imaging results that were available during my care of the patient were reviewed by me and considered in my medical decision making (see chart for details).    MDM Rules/Calculators/A&P                      Patient presents to the emergency department for evaluation of R elbow pain. Patient  neurovascularly intact on exam. Imaging negative for fracture, dislocation, bony deformity. No erythema, heat to touch to the affected area; no concern for septic joint. Compartments in the affected extremity are soft. Plan for supportive management including RICE and NSAIDs; primary care follow up as needed. Return precautions discussed and provided. Patient discharged in stable  condition with no unaddressed concerns.   Final Clinical Impression(s) / ED Diagnoses Final diagnoses:  Contusion of right elbow, initial encounter    Rx / DC Orders ED Discharge Orders    None       Antonietta Breach, PA-C 10/21/19 2336    Merryl Hacker, MD 10/22/19 [phone]

## 2019-10-21 NOTE — ED Triage Notes (Signed)
Patient accidentally hit by a toy car remote control at right elbow this evening reports pain with mild swelling.

## 2019-10-21 NOTE — Discharge Instructions (Signed)
Take Tylenol or ibuprofen for management of pain.  Ice your elbow 3-4 times per day for 15 to 20 minutes each time.  Follow-up with your primary care doctor if symptoms persist.

## 2019-10-22 NOTE — ED Notes (Signed)
Pt verbalized understanding of d/c instruction and follow up care along with s/s requiring return to ED. Pt had no further questions at this time.

## 2021-02-23 ENCOUNTER — Ambulatory Visit: Payer: Self-pay

## 2024-05-20 ENCOUNTER — Other Ambulatory Visit: Payer: Self-pay

## 2024-05-20 ENCOUNTER — Ambulatory Visit
Admission: RE | Admit: 2024-05-20 | Discharge: 2024-05-20 | Disposition: A | Discharge status: Home / Self Care | Attending: Physician Assistant | Admitting: Physician Assistant

## 2024-05-20 VITALS — BP 110/66 | HR 75 | Temp 98.4°F | Resp 17 | Ht 65.0 in | Wt 176.0 lb

## 2024-05-20 DIAGNOSIS — Z79899 Other long term (current) drug therapy: Secondary | ICD-10-CM

## 2024-05-20 DIAGNOSIS — U071 COVID-19: Secondary | ICD-10-CM

## 2024-05-20 LAB — POC COVID19/FLU A&B COMBO
Covid Antigen, POC: POSITIVE — AB
Influenza A Antigen, POC: NEGATIVE
Influenza B Antigen, POC: NEGATIVE

## 2024-05-20 MED ORDER — PAXLOVID (300/100) 20 X 150 MG & 10 X 100MG PO TBPK: 3.0000 | ORAL_TABLET | Freq: Two times a day (BID) | ORAL | 0 refills | Status: AC

## 2024-05-20 NOTE — Discharge Instructions (Addendum)
 VISIT SUMMARY:  You came in today with symptoms of a viral infection, including body aches, congestion, and fatigue. After evaluation, you tested positive for COVID-19. We discussed treatment options and preventive measures to protect your family.  YOUR PLAN:  -COVID-19 INFECTION WITH ACUTE UPPER RESPIRATORY SYMPTOMS: You have tested positive for COVID-19, which is a viral infection that affects the respiratory system. We have prescribed Paxlovid  to help reduce the severity and duration of your symptoms. You should continue taking over-the-counter medications like ibuprofen  for body aches and Benadryl  for sleep. We have also ordered lab work to check your kidney function before you start Paxlovid . It's important to quarantine or isolate for the first five days of symptoms, wear a mask, sanitize surfaces, and maintain distance from others, especially your children.  INSTRUCTIONS:  Please pick up your Paxlovid  prescription from the pharmacy once your bloodwork is complete and your kidney function is checked to ensure that you can take the medication without obvious complications.  Continue to monitor your symptoms and follow the isolation guidelines to prevent spreading the virus to your family. If you have any questions or if your symptoms worsen, please contact our office.  You were seen today for concerns of nasal congestion, fatigue and nausea.  Your testing was positive for COVID-19. You are currently on day 2 of symptoms.  This means that you are within the appropriate window to start an antiviral medication called Paxlovid .  I have sent this in to the pharmacy that we have on file. Please note that Paxlovid  can cause nausea, vomiting, has been reported to have a bad taste.  It can also interact with multiple medications.   In addition to the antiviral medication you can take over-the-counter medications such as DayQuil/NyQuil Alka-Seltzer day and night TheraFlu day and night  If you have  High blood pressure I recommend taking Mucinex, Robitussin, and Acetaminophen  rather than the combination medications. Flonase nasal spray Nasal saline flushes/rinses Humidifier at night Warm tea with honey Ibuprofen  as needed  Please make sure that you are getting plenty of rest and staying well-hydrated If you develop any of the following symptoms please go to the emergency room: Significant difficulty breathing, chest pain, fevers that are not responding to acetaminophen  or ibuprofen , swelling of one of your arms or legs, inability to eat or drink leading to dehydration, confusion, loss of consciousness.

## 2024-05-20 NOTE — ED Triage Notes (Addendum)
 Pt presents with complaints of nasal congestion and generalized body aches. Symptoms began early yesterday morning. Currently rates overall pain a 6/10. OTC Benadryl  + Ibuprofen  taken with minimal relief, made pt drowsy. Pt shares her coworkers are sick as well. Does endorse chills however no fevers at home.

## 2024-05-20 NOTE — ED Provider Notes (Signed)
 [facility]    CSN: [id number] Arrival date & time: 05/20/24  1530      History   Chief Complaint Chief Complaint  Patient presents with   Nasal Congestion    Congestion with body ache - Entered by patient   Generalized Body Aches    HPI Shannon Montoya is a 38 y.o. female.  has a past medical history of Anemia, Hidradenitis, HPV (human papilloma virus) anogenital infection, Hypertension, Left axillary hidradenitis (05/2016), and Vaginal Pap smear, abnormal.   HPI  Discussed the use of AI scribe software for clinical note transcription with the patient, who gave verbal consent to proceed.  The patient presents with symptoms suggestive of a viral infection, including body aches and congestion.  Symptoms began yesterday morning, including body aches, cold chills, congestion, and a sore throat. She experiences fatigue, with exhaustion occurring after 15 to 20 minutes of activity. Nausea is present, but there is no vomiting or diarrhea.  She has maintained fluid intake but has difficulty eating. Ibuprofen  has been used for body aches and Benadryl  for sleep, but she is unsure how to manage congestion. A coworker has had similar symptoms recently.  No history of asthma or difficulty breathing. No fever, ear pain, or vomiting. She is not currently taking any prescription medications.  She has four children and is concerned about transmitting her illness to them.   Past Medical History:  Diagnosis Date   Anemia    Hidradenitis    HPV (human papilloma virus) anogenital infection    Hypertension    Left axillary hidradenitis 05/2016   Vaginal Pap smear, abnormal     Patient Active Problem List   Diagnosis Date Noted   Encounter for induction of labor 06/24/2019   Postpartum care following vaginal delivery (10/21) 06/16/2018   SVD (spontaneous vaginal delivery) 06/16/2018   Encounter for planned induction of labor 06/15/2018    Past Surgical History:   Procedure Laterality Date   HYDRADENITIS EXCISION Right 03/27/2016   Procedure: EXCISION  HIDRADENITIS RIGHT AXILLA RYAN POLLACK CLOSURE;  Surgeon: Elna Pick, MD;  Location: [facility];  Service: Plastics;  Laterality: Right;   HYDRADENITIS EXCISION Left 06/05/2016   Procedure: EXCISION HIDRADENITIS left AXILLA with ryan pollock closure;  Surgeon: Elna Pick, MD;  Location: [facility];  Service: Plastics;  Laterality: Left;   LAPAROSCOPIC TUBAL LIGATION Bilateral 08/19/2019   Procedure: LAPAROSCOPIC TUBAL LIGATION with Bipolar Cautery;  Surgeon: Rutherford Gain, MD;  Location: [facility];  Service: Gynecology;  Laterality: Bilateral;    OB History     Gravida  5   Para  4   Term  2   Preterm  2   AB  1   Living  4      SAB      IAB  1   Ectopic      Multiple  0   Live Births  4            Home Medications    Prior to Admission medications   Medication Sig Start Date End Date Taking? Authorizing Provider  nirmatrelvir/ritonavir (PAXLOVID , 300/100,) 20 x 150 MG & 10 x 100MG  TBPK Take 3 tablets by mouth 2 (two) times daily for 5 days. Patient GFR is to be determined (labs pending). Take nirmatrelvir (150 mg) two tablets twice daily for 5 days and ritonavir (100 mg) one tablet twice daily for 5 days. 05/20/24 05/25/24 Yes Jensyn Shave E, PA-C  Family History Family History  Problem Relation Age of Onset   Heart disease Mother    Heart disease Father    Hypertension Father    Heart attack Father    Breast cancer Paternal Grandmother    Diabetes Paternal Grandmother     Social History Social History   Tobacco Use   Smoking status: Every Day    Current packs/day: 0.25    Average packs/day: 0.3 packs/day for 11.0 years (2.8 ttl pk-yrs)    Types: Cigarettes   Smokeless tobacco: Never   Tobacco comments:    2-3 cig./day  Vaping Use   Vaping status: Never Used  Substance Use Topics   Alcohol  use: No   Drug use: No     Allergies   Patient has no known allergies.   Review of Systems Review of Systems  Constitutional:  Positive for chills and fatigue. Negative for fever.  HENT:  Positive for congestion, postnasal drip and sore throat. Negative for ear pain and rhinorrhea.   Respiratory:  Negative for cough, shortness of breath and wheezing.   Gastrointestinal:  Positive for nausea. Negative for diarrhea and vomiting.  Musculoskeletal:  Positive for myalgias.     Physical Exam Triage Vital Signs ED Triage Vitals  Encounter Vitals Group     BP 05/20/24 1539 110/66     Girls Systolic BP Percentile --      Girls Diastolic BP Percentile --      Boys Systolic BP Percentile --      Boys Diastolic BP Percentile --      Pulse Rate 05/20/24 1539 75     Resp 05/20/24 1539 17     Temp 05/20/24 1539 98.4 F (36.9 C)     Temp Source 05/20/24 1539 Oral     SpO2 05/20/24 1539 99 %     Weight 05/20/24 1539 176 lb (79.8 kg)     Height 05/20/24 1539 5' 5 (1.651 m)     Head Circumference --      Peak Flow --      Pain Score 05/20/24 1548 6     Pain Loc --      Pain Education --      Exclude from Growth Chart --    No data found.  Updated Vital Signs BP 110/66 (BP Location: Right Arm)   Pulse 75   Temp 98.4 F (36.9 C) (Oral)   Resp 17   Ht 5' 5 (1.651 m)   Wt 176 lb (79.8 kg)   LMP 04/28/2024 (Approximate)   SpO2 99%   Breastfeeding No   BMI 29.29 kg/m   Visual Acuity Right Eye Distance:   Left Eye Distance:   Bilateral Distance:    Right Eye Near:   Left Eye Near:    Bilateral Near:     Physical Exam Vitals reviewed.  Constitutional:      General: She is awake.     Appearance: Normal appearance. She is well-developed and well-groomed.  HENT:     Head: Normocephalic and atraumatic.     Right Ear: Hearing, tympanic membrane and ear canal normal.     Left Ear: Hearing, tympanic membrane and ear canal normal.     Mouth/Throat:     Lips: Pink.      Mouth: Mucous membranes are moist.     Pharynx: Oropharynx is clear. Uvula midline. No pharyngeal swelling, oropharyngeal exudate, posterior oropharyngeal erythema, uvula swelling or postnasal drip.  Cardiovascular:     Rate and  Rhythm: Normal rate and regular rhythm.     Pulses: Normal pulses.          Radial pulses are 2+ on the right side and 2+ on the left side.     Heart sounds: Normal heart sounds. No murmur heard.    No friction rub. No gallop.  Pulmonary:     Effort: Pulmonary effort is normal.     Breath sounds: Normal breath sounds. No decreased air movement. No decreased breath sounds, wheezing, rhonchi or rales.  Musculoskeletal:     Cervical back: Normal range of motion and neck supple.  Lymphadenopathy:     Head:     Right side of head: No submental, submandibular or preauricular adenopathy.     Left side of head: No submental, submandibular or preauricular adenopathy.     Cervical:     Right cervical: No superficial cervical adenopathy.    Left cervical: No superficial cervical adenopathy.     Upper Body:     Right upper body: No supraclavicular adenopathy.     Left upper body: No supraclavicular adenopathy.  Skin:    General: Skin is warm and dry.  Neurological:     General: No focal deficit present.     Mental Status: She is alert and oriented to person, place, and time.  Psychiatric:        Mood and Affect: Mood normal.        Behavior: Behavior normal. Behavior is cooperative.        Judgment: Judgment normal.      UC Treatments / Results  Labs (all labs ordered are listed, but only abnormal results are displayed) Labs Reviewed  POC COVID19/FLU A&B COMBO - Abnormal; Notable for the following components:      Result Value   Covid Antigen, POC Positive (*)    All other components within normal limits  COMPREHENSIVE METABOLIC PANEL WITH GFR    EKG   Radiology No results found.  Procedures Procedures (including critical care time)  Medications  Ordered in UC Medications - No data to display  Initial Impression / Assessment and Plan / UC Course  I have reviewed the triage vital signs and the nursing notes.  Pertinent labs & imaging results that were available during my care of the patient were reviewed by me and considered in my medical decision making (see chart for details).      Final Clinical Impressions(s) / UC Diagnoses   Final diagnoses:  COVID-19  Medication management   COVID-19 infection with acute upper respiratory symptoms  Positive COVID-19 test with symptoms starting yesterday, indicating day two of symptoms. Symptoms include myalgia, chills, nasal congestion, pharyngitis, fatigue, and nausea. No fever, otalgia, or gastrointestinal symptoms. No asthma or dyspnea. Discussed Paxlovid  to reduce symptom severity and duration, especially effective if started within the first five days. Discussed potential side effects, including nausea, vomiting, dysgeusia, and need for renal function lab work due to renal clearance. Paxlovid  is typically recommended for those at risk of severe symptoms, such as diabetes or COPD, but can be considered to reduce progression risk in otherwise healthy individuals. She opted to start Paxlovid  after understanding risks and benefits. - Prescribe Paxlovid  and send prescription to pharmacy. - Order lab work to check renal function before starting Paxlovid . - Advise continuation of over-the-counter medications for symptom relief. - Instruct to quarantine or isolate for the first five days of symptoms, wear a mask, sanitize surfaces, and maintain distance from others, especially children. - Provide  work note.     Discharge Instructions      VISIT SUMMARY:  You came in today with symptoms of a viral infection, including body aches, congestion, and fatigue. After evaluation, you tested positive for COVID-19. We discussed treatment options and preventive measures to protect your family.  YOUR  PLAN:  -COVID-19 INFECTION WITH ACUTE UPPER RESPIRATORY SYMPTOMS: You have tested positive for COVID-19, which is a viral infection that affects the respiratory system. We have prescribed Paxlovid  to help reduce the severity and duration of your symptoms. You should continue taking over-the-counter medications like ibuprofen  for body aches and Benadryl  for sleep. We have also ordered lab work to check your kidney function before you start Paxlovid . It's important to quarantine or isolate for the first five days of symptoms, wear a mask, sanitize surfaces, and maintain distance from others, especially your children.  INSTRUCTIONS:  Please pick up your Paxlovid  prescription from the pharmacy once your bloodwork is complete and your kidney function is checked to ensure that you can take the medication without obvious complications.  Continue to monitor your symptoms and follow the isolation guidelines to prevent spreading the virus to your family. If you have any questions or if your symptoms worsen, please contact our office.  You were seen today for concerns of nasal congestion, fatigue and nausea.  Your testing was positive for COVID-19. You are currently on day 2 of symptoms.  This means that you are within the appropriate window to start an antiviral medication called Paxlovid .  I have sent this in to the pharmacy that we have on file. Please note that Paxlovid  can cause nausea, vomiting, has been reported to have a bad taste.  It can also interact with multiple medications.   In addition to the antiviral medication you can take over-the-counter medications such as DayQuil/NyQuil Alka-Seltzer day and night TheraFlu day and night  If you have High blood pressure I recommend taking Mucinex, Robitussin, and Acetaminophen  rather than the combination medications. Flonase nasal spray Nasal saline flushes/rinses Humidifier at night Warm tea with honey Ibuprofen  as needed  Please make sure that  you are getting plenty of rest and staying well-hydrated If you develop any of the following symptoms please go to the emergency room: Significant difficulty breathing, chest pain, fevers that are not responding to acetaminophen  or ibuprofen , swelling of one of your arms or legs, inability to eat or drink leading to dehydration, confusion, loss of consciousness.      ED Prescriptions     Medication Sig Dispense Auth. Provider   nirmatrelvir/ritonavir (PAXLOVID , 300/100,) 20 x 150 MG & 10 x 100MG  TBPK Take 3 tablets by mouth 2 (two) times daily for 5 days. Patient GFR is to be determined (labs pending). Take nirmatrelvir (150 mg) two tablets twice daily for 5 days and ritonavir (100 mg) one tablet twice daily for 5 days. 30 tablet Krist Rosenboom E, PA-C      PDMP not reviewed this encounter.   Marylene Rocky BRAVO, PA-C 05/20/24 1649

## 2024-05-21 LAB — COMPREHENSIVE METABOLIC PANEL WITH GFR
ALT: 10 IU/L (ref 0–32)
AST: 16 IU/L (ref 0–40)
Albumin: 4.4 g/dL (ref 3.9–4.9)
Alkaline Phosphatase: 63 IU/L (ref 41–116)
BUN/Creatinine Ratio: 7 — ABNORMAL LOW (ref 9–23)
BUN: 7 mg/dL (ref 6–20)
Bilirubin Total: <0.2 mg/dL (ref 0.0–1.2)
CO2: 21 mmol/L (ref 20–29)
Calcium: 9.1 mg/dL (ref 8.7–10.2)
Chloride: 105 mmol/L (ref 96–106)
Creatinine, Ser: 1.07 mg/dL — ABNORMAL HIGH (ref 0.57–1.00)
Globulin, Total: 2.9 g/dL (ref 1.5–4.5)
Glucose: 97 mg/dL (ref 70–99)
Potassium: 3.6 mmol/L (ref 3.5–5.2)
Sodium: 139 mmol/L (ref 134–144)
Total Protein: 7.3 g/dL (ref 6.0–8.5)
eGFR: 68 mL/min/1.73 (ref >59)

## 2024-05-22 ENCOUNTER — Ambulatory Visit (HOSPITAL_COMMUNITY): Payer: Self-pay
